# Patient Record
Sex: Female | Born: 1960 | Race: White | Hispanic: No | Smoking: Never smoker
Health system: Southern US, Community
[De-identification: ages and names within clinical notes are randomized; demographics above are authoritative.]

## PROBLEM LIST (undated history)

## (undated) DIAGNOSIS — Z803 Family history of malignant neoplasm of breast: Secondary | ICD-10-CM

## (undated) DIAGNOSIS — Z808 Family history of malignant neoplasm of other organs or systems: Secondary | ICD-10-CM

## (undated) DIAGNOSIS — Z8042 Family history of malignant neoplasm of prostate: Secondary | ICD-10-CM

## (undated) DIAGNOSIS — B009 Herpesviral infection, unspecified: Secondary | ICD-10-CM

## (undated) HISTORY — DX: Herpesviral infection, unspecified: B00.9

## (undated) HISTORY — DX: Family history of malignant neoplasm of prostate: Z80.42

## (undated) HISTORY — DX: Family history of malignant neoplasm of other organs or systems: Z80.8

## (undated) HISTORY — PX: KNEE SURGERY: SHX244

## (undated) HISTORY — DX: Family history of malignant neoplasm of breast: Z80.3

---

## 1987-09-18 HISTORY — PX: OTHER SURGICAL HISTORY: SHX169

## 1998-04-20 ENCOUNTER — Ambulatory Visit (HOSPITAL_COMMUNITY): Admission: RE | Admit: 1998-04-20 | Discharge: 1998-04-20 | Payer: Self-pay | Admitting: *Deleted

## 1999-05-11 ENCOUNTER — Ambulatory Visit (HOSPITAL_COMMUNITY): Admission: RE | Admit: 1999-05-11 | Discharge: 1999-05-11 | Payer: Self-pay | Admitting: *Deleted

## 1999-05-11 ENCOUNTER — Encounter (INDEPENDENT_AMBULATORY_CARE_PROVIDER_SITE_OTHER): Payer: Self-pay | Admitting: Specialist

## 1999-05-11 HISTORY — PX: DILATION AND CURETTAGE OF UTERUS: SHX78

## 1999-09-18 HISTORY — PX: LIPOMA EXCISION: SHX5283

## 2000-01-11 ENCOUNTER — Other Ambulatory Visit: Admission: RE | Admit: 2000-01-11 | Discharge: 2000-01-11 | Payer: Self-pay | Admitting: Plastic Surgery

## 2000-07-02 ENCOUNTER — Other Ambulatory Visit: Admission: RE | Admit: 2000-07-02 | Discharge: 2000-07-02 | Payer: Self-pay | Admitting: *Deleted

## 2001-05-05 ENCOUNTER — Other Ambulatory Visit: Admission: RE | Admit: 2001-05-05 | Discharge: 2001-05-05 | Payer: Self-pay | Admitting: *Deleted

## 2002-05-29 ENCOUNTER — Other Ambulatory Visit: Admission: RE | Admit: 2002-05-29 | Discharge: 2002-05-29 | Payer: Self-pay | Admitting: Obstetrics & Gynecology

## 2003-06-02 ENCOUNTER — Other Ambulatory Visit: Admission: RE | Admit: 2003-06-02 | Discharge: 2003-06-02 | Payer: Self-pay | Admitting: Obstetrics & Gynecology

## 2005-01-17 ENCOUNTER — Ambulatory Visit (HOSPITAL_COMMUNITY): Admission: RE | Admit: 2005-01-17 | Discharge: 2005-01-17 | Payer: Self-pay | Admitting: Obstetrics & Gynecology

## 2006-10-29 ENCOUNTER — Ambulatory Visit (HOSPITAL_COMMUNITY): Admission: RE | Admit: 2006-10-29 | Discharge: 2006-10-29 | Payer: Self-pay | Admitting: Obstetrics & Gynecology

## 2008-07-08 ENCOUNTER — Ambulatory Visit (HOSPITAL_COMMUNITY): Admission: RE | Admit: 2008-07-08 | Discharge: 2008-07-08 | Payer: Self-pay | Admitting: Obstetrics & Gynecology

## 2010-06-20 ENCOUNTER — Ambulatory Visit (HOSPITAL_COMMUNITY): Admission: RE | Admit: 2010-06-20 | Discharge: 2010-06-20 | Payer: Self-pay | Admitting: Obstetrics & Gynecology

## 2010-10-08 ENCOUNTER — Encounter: Payer: Self-pay | Admitting: Obstetrics & Gynecology

## 2012-05-14 ENCOUNTER — Other Ambulatory Visit (HOSPITAL_COMMUNITY): Payer: Self-pay | Admitting: Obstetrics & Gynecology

## 2012-05-14 DIAGNOSIS — Z1231 Encounter for screening mammogram for malignant neoplasm of breast: Secondary | ICD-10-CM

## 2012-05-30 ENCOUNTER — Ambulatory Visit (HOSPITAL_COMMUNITY)
Admission: RE | Admit: 2012-05-30 | Discharge: 2012-05-30 | Disposition: A | Discharge status: Home / Self Care | Payer: 59 | Source: Ambulatory Visit | Attending: Obstetrics & Gynecology | Admitting: Obstetrics & Gynecology

## 2012-05-30 DIAGNOSIS — Z1231 Encounter for screening mammogram for malignant neoplasm of breast: Secondary | ICD-10-CM | POA: Insufficient documentation

## 2014-02-19 ENCOUNTER — Other Ambulatory Visit (HOSPITAL_COMMUNITY): Payer: Self-pay | Admitting: Obstetrics & Gynecology

## 2014-02-19 DIAGNOSIS — Z1231 Encounter for screening mammogram for malignant neoplasm of breast: Secondary | ICD-10-CM

## 2014-03-15 ENCOUNTER — Ambulatory Visit (HOSPITAL_COMMUNITY)
Admission: RE | Admit: 2014-03-15 | Discharge: 2014-03-15 | Disposition: A | Discharge status: Home / Self Care | Payer: Private Health Insurance - Indemnity | Source: Ambulatory Visit | Attending: Obstetrics & Gynecology | Admitting: Obstetrics & Gynecology

## 2014-03-15 DIAGNOSIS — Z1231 Encounter for screening mammogram for malignant neoplasm of breast: Secondary | ICD-10-CM | POA: Insufficient documentation

## 2014-05-28 ENCOUNTER — Encounter: Payer: Self-pay | Admitting: Gastroenterology

## 2014-07-26 ENCOUNTER — Ambulatory Visit (AMBULATORY_SURGERY_CENTER): Payer: Self-pay | Admitting: *Deleted

## 2014-07-26 VITALS — Ht 64.0 in | Wt 156.0 lb

## 2014-07-26 DIAGNOSIS — Z1211 Encounter for screening for malignant neoplasm of colon: Secondary | ICD-10-CM

## 2014-07-26 MED ORDER — MOVIPREP 100 G PO SOLR: ORAL | Status: DC

## 2014-07-26 NOTE — Progress Notes (Signed)
Patient denies any allergies to eggs or soy. No past surgeries per pt. Patient denies any oxygen use at home and does not take any diet/weight loss medications. EMMI education assisgned to patient on colonoscopy, this was explained and instructions given to patient. 

## 2014-08-06 ENCOUNTER — Encounter: Payer: Self-pay | Admitting: Gastroenterology

## 2014-08-06 ENCOUNTER — Ambulatory Visit (AMBULATORY_SURGERY_CENTER): Payer: Managed Care, Other (non HMO) | Admitting: Gastroenterology

## 2014-08-06 VITALS — BP 110/61 | HR 64 | Temp 98.6°F | Resp 26 | Ht 64.0 in | Wt 156.0 lb

## 2014-08-06 DIAGNOSIS — Z1211 Encounter for screening for malignant neoplasm of colon: Secondary | ICD-10-CM

## 2014-08-06 DIAGNOSIS — D123 Benign neoplasm of transverse colon: Secondary | ICD-10-CM

## 2014-08-06 MED ORDER — SODIUM CHLORIDE 0.9 % IV SOLN: 500.0000 mL | INTRAVENOUS | Status: DC

## 2014-08-06 NOTE — Progress Notes (Signed)
Report to PACU, RN, vss, BBS= Clear.  

## 2014-08-06 NOTE — Patient Instructions (Signed)
YOU HAD AN ENDOSCOPIC PROCEDURE TODAY AT THE [facility]: Refer to the procedure report that was given to you for any specific questions about what was found during the examination.  If the procedure report does not answer your questions, please call your gastroenterologist to clarify.  If you requested that your care partner not be given the details of your procedure findings, then the procedure report has been included in a sealed envelope for you to review at your convenience later.  YOU SHOULD EXPECT: Some feelings of bloating in the abdomen. Passage of more gas than usual.  Walking can help get rid of the air that was put into your GI tract during the procedure and reduce the bloating. If you had a lower endoscopy (such as a colonoscopy or flexible sigmoidoscopy) you may notice spotting of blood in your stool or on the toilet paper. If you underwent a bowel prep for your procedure, then you may not have a normal bowel movement for a few days.  DIET: Your first meal following the procedure should be a light meal and then it is ok to progress to your normal diet.  A half-sandwich or bowl of soup is an example of a good first meal.  Heavy or fried foods are harder to digest and may make you feel nauseous or bloated.  Likewise meals heavy in dairy and vegetables can cause extra gas to form and this can also increase the bloating.  Drink plenty of fluids but you should avoid alcoholic beverages for 24 hours.  ACTIVITY: Your care partner should take you home directly after the procedure.  You should plan to take it easy, moving slowly for the rest of the day.  You can resume normal activity the day after the procedure however you should NOT DRIVE or use heavy machinery for 24 hours (because of the sedation medicines used during the test).    SYMPTOMS TO REPORT IMMEDIATELY: A gastroenterologist can be reached at any hour.  During normal business hours, 8:30 AM to 5:00 PM Monday through Friday,  call [phone].  After hours and on weekends, please call the GI answering service at [phone] who will take a message and have the physician on call contact you.   Following lower endoscopy (colonoscopy or flexible sigmoidoscopy):  Excessive amounts of blood in the stool  Significant tenderness or worsening of abdominal pains  Swelling of the abdomen that is new, acute  Fever of 100F or higher  FOLLOW UP: If any biopsies were taken you will be contacted by phone or by letter within the next 1-3 weeks.  Call your gastroenterologist if you have not heard about the biopsies in 3 weeks.  Our staff will call the home number listed on your records the next business day following your procedure to check on you and address any questions or concerns that you may have at that time regarding the information given to you following your procedure. This is a courtesy call and so if there is no answer at the home number and we have not heard from you through the emergency physician on call, we will assume that you have returned to your regular daily activities without incident.  SIGNATURES/CONFIDENTIALITY: You and/or your care partner have signed paperwork which will be entered into your electronic medical record.  These signatures attest to the fact that that the information above on your After Visit Summary has been reviewed and is understood.  Full responsibility of the confidentiality of this   discharge information lies with you and/or your care-partner.  Please read the handouts given to you by your recovery room nurse.

## 2014-08-06 NOTE — Progress Notes (Signed)
Called to room to assist during endoscopic procedure.  Patient ID and intended procedure confirmed with present staff. Received instructions for my participation in the procedure from the performing physician.  

## 2014-08-06 NOTE — Op Note (Signed)
[facility]  [address] [place], [address]   COLONOSCOPY PROCEDURE REPORT  PATIENT: Laura Pearson, Laura Pearson  MR#: [id number] BIRTHDATE: 05/07/1961 , 53  yrs. old GENDER: female ENDOSCOPIST: Milus Banister, MD PROCEDURE DATE:  08/06/2014 PROCEDURE:   Colonoscopy with snare polypectomy First Screening Colonoscopy - Avg.  risk and is 50 yrs.  old or older Yes.  Prior Negative Screening - Now for repeat screening. N/A  History of Adenoma - Now for follow-up colonoscopy & has been > or = to 3 yrs.  N/A  Polyps Removed Today? Yes. ASA CLASS:   Class II INDICATIONS:average risk for colon cancer. MEDICATIONS: Monitored anesthesia care and Propofol 240 mg IV  DESCRIPTION OF PROCEDURE:   After the risks benefits and alternatives of the procedure were thoroughly explained, informed consent was obtained.  The digital rectal exam revealed no abnormalities of the rectum.   The LB [id number]  endoscope was introduced through the anus and advanced to the cecum, which was identified by both the appendix and ileocecal valve. No adverse events experienced.   The quality of the prep was excellent.  The instrument was then slowly withdrawn as the colon was fully examined.  COLON FINDINGS: A sessile polyp measuring 4 mm in size was found in the transverse colon.  A polypectomy was performed with a cold snare.  The resection was complete, the polyp tissue was completely retrieved and sent to histology.  Retroflexed views revealed no abnormalities. The time to cecum=3 minutes 19 seconds.  Withdrawal time=11 minutes 08 seconds.  The scope was withdrawn and the procedure completed. COMPLICATIONS: There were no immediate complications.  ENDOSCOPIC IMPRESSION: Sessile polyp was found in the transverse colon; polypectomy was performed with a cold snare  RECOMMENDATIONS: If the polyp(s) removed today are proven to be adenomatous (pre-cancerous) polyps, you will need a repeat  colonoscopy in 5 years.  Otherwise you should continue to follow colorectal cancer screening guidelines for "routine risk" patients with colonoscopy in 10 years.  You will receive a letter within 1-2 weeks with the results of your biopsy as well as final recommendations.  Please call my office if you have not received a letter after 3 weeks.  eSigned:  Milus Banister, MD 08/06/2014 11:36 AM

## 2014-08-09 ENCOUNTER — Telehealth: Payer: Self-pay | Admitting: *Deleted

## 2014-08-09 NOTE — Telephone Encounter (Signed)
  Follow up Call-  Call back number 08/06/2014  Post procedure Call Back phone  # [phone]  Permission to leave phone message Yes    [facility]

## 2014-08-17 ENCOUNTER — Encounter: Payer: Self-pay | Admitting: Gastroenterology

## 2016-08-03 ENCOUNTER — Encounter: Payer: Self-pay | Admitting: Obstetrics & Gynecology

## 2017-03-14 ENCOUNTER — Encounter: Payer: Self-pay | Admitting: Obstetrics & Gynecology

## 2017-03-14 ENCOUNTER — Telehealth: Payer: Self-pay | Admitting: *Deleted

## 2017-03-14 ENCOUNTER — Ambulatory Visit (INDEPENDENT_AMBULATORY_CARE_PROVIDER_SITE_OTHER): Payer: 59 | Admitting: Obstetrics & Gynecology

## 2017-03-14 VITALS — BP 130/72 | Ht 64.0 in | Wt 150.0 lb

## 2017-03-14 DIAGNOSIS — Z01411 Encounter for gynecological examination (general) (routine) with abnormal findings: Secondary | ICD-10-CM

## 2017-03-14 DIAGNOSIS — Z780 Asymptomatic menopausal state: Secondary | ICD-10-CM

## 2017-03-14 DIAGNOSIS — F5102 Adjustment insomnia: Secondary | ICD-10-CM | POA: Diagnosis not present

## 2017-03-14 DIAGNOSIS — F419 Anxiety disorder, unspecified: Secondary | ICD-10-CM | POA: Diagnosis not present

## 2017-03-14 MED ORDER — ESTRADIOL 0.05 MG/24HR TD PTWK: 0.0500 mg | MEDICATED_PATCH | TRANSDERMAL | 4 refills | Status: DC

## 2017-03-14 MED ORDER — ESZOPICLONE 2 MG PO TABS: 2.0000 mg | ORAL_TABLET | Freq: Every evening | ORAL | 4 refills | Status: DC | PRN

## 2017-03-14 MED ORDER — PROGESTERONE MICRONIZED 100 MG PO CAPS: 100.0000 mg | ORAL_CAPSULE | Freq: Every day | ORAL | 4 refills | Status: DC

## 2017-03-14 MED ORDER — ALPRAZOLAM 0.5 MG PO TABS: 0.5000 mg | ORAL_TABLET | Freq: Two times a day (BID) | ORAL | 0 refills | Status: DC | PRN

## 2017-03-14 NOTE — Telephone Encounter (Signed)
Pt was seen today and 2 Rx printed for Lunesta 2mg  and Xanax 0.5 mg, I called both Rx into pharmacy listed [facility].

## 2017-03-14 NOTE — Progress Notes (Signed)
Laura Pearson 05-28-1961 [id number]   History:    56 y.o.  [id number] Married.  Husband on disability.  Patient is a Management consultant.  Son done with [facility], working.  Daughter coming back from [employer] for a course in Dental care.  RP:  Established patient presenting for annual gyn exam   HPI:  Menopause.  Well on Estradiol 0.05 patch/Prometrium 100 HS.  No PMB.  No pelvic pain.  Uses a vulvar moisturizer.  Still under stress, but managing better, organizing and making plans.  Uses Lunesta PRN for insomnia.  Planning a trip to [place] with her dad, needs Xanax for flight anxiety.  Breasts wnl.  Mictions/BMs wnl.  Past medical history,surgical history, family history and social history were all reviewed and documented in the EPIC chart.  Gynecologic History No LMP recorded. Patient is postmenopausal. Contraception: post menopausal status Last Pap: 05/2016. Results were: Neg/HPV HR neg Last mammogram: 07/2016. Results were: Negative [facility] 2015  Obstetric History OB History  Gravida Para Term Preterm AB Living  4 2     1 2   SAB TAB Ectopic Multiple Live Births  1            # Outcome Date GA Lbr Len/2nd Weight Sex Delivery Anes PTL Lv  4 Gravida           3 SAB           2 Para           1 Para                ROS: A ROS was performed and pertinent positives and negatives are included in the history.  GENERAL: No fevers or chills. HEENT: No change in vision, no earache, sore throat or sinus congestion. NECK: No pain or stiffness. CARDIOVASCULAR: No chest pain or pressure. No palpitations. PULMONARY: No shortness of breath, cough or wheeze. GASTROINTESTINAL: No abdominal pain, nausea, vomiting or diarrhea, melena or bright red blood per rectum. GENITOURINARY: No urinary frequency, urgency, hesitancy or dysuria. MUSCULOSKELETAL: No joint or muscle pain, no back pain, no recent trauma. DERMATOLOGIC: No rash, no itching, no lesions. ENDOCRINE: No polyuria, polydipsia, no heat or cold  intolerance. No recent change in weight. HEMATOLOGICAL: No anemia or easy bruising or bleeding. NEUROLOGIC: No headache, seizures, numbness, tingling or weakness. PSYCHIATRIC: No depression, no loss of interest in normal activity or change in sleep pattern.     Exam:   BP 130/72   Ht 5\' 4"  (1.626 m)   Wt 150 lb (68 kg)   BMI 25.75 kg/m   Body mass index is 25.75 kg/m.  General appearance : Well developed well nourished female. No acute distress HEENT: Eyes: no retinal hemorrhage or exudates,  Neck supple, trachea midline, no carotid bruits, no thyroidmegaly Lungs: Clear to auscultation, no rhonchi or wheezes, or rib retractions  Heart: Regular rate and rhythm, no murmurs or gallops Breast:Examined in sitting and supine position were symmetrical in appearance, no palpable masses or tenderness,  no skin retraction, no nipple inversion, no nipple discharge, no skin discoloration, no axillary or supraclavicular lymphadenopathy Abdomen: no palpable masses or tenderness, no rebound or guarding Extremities: no edema or skin discoloration or tenderness  Pelvic:  Bartholin, Urethra, Skene Glands: Within normal limits             Vagina: No gross lesions or discharge  Cervix: No gross lesions or discharge  Uterus  AV, normal size, shape and consistency, non-tender and mobile  Adnexa  Without masses or tenderness  Anus and perineum  normal    Assessment/Plan:  56 y.o. female for annual exam   1. Encounter for gynecological examination with abnormal finding Normal gyn exam except mild Atrophic vaginitis.  Well with vulvar moisturizer, will try Replens.  Pap neg/HPV HR neg 05/2016, will repeat next year.  Breasts wnl.  Next screening Mammo 07/2017.  2. Menopause present Symptoms well controled on Estradiol patch 0.05 and Prometrium 100 mg HS, represcribed.  Will call back if needs Estradiol cream vaginally and on vulva.  3. Adjustment insomnia Lunesta represcribed.  Recommend not taking  every night.  Encouraged to exercise regularly.  4. Anxiety Flight anxiety.  Xanax 0.5 mg #4, no refill.  Prescribed for upcoming trip.  Counseling on above issues >50% x 10 minutes.  Princess Bruins MD, 8:31 AM 03/14/2017

## 2017-03-14 NOTE — Patient Instructions (Signed)
1. Encounter for gynecological examination with abnormal finding Normal gyn exam except mild Atrophic vaginitis.  Well with vulvar moisturizer, will try Replens.  Pap neg/HPV HR neg 05/2016, will repeat next year.  Breasts wnl.  Next screening Mammo 07/2017.  2. Menopause present Symptoms well controled on Estradiol patch 0.05 and Prometrium 100 mg HS, represcribed.  Will call back if needs Estradiol cream vaginally and on vulva.  3. Adjustment insomnia Lunesta represcribed.  Recommend not taking every night.  Encouraged to exercise regularly.  4. Anxiety Flight anxiety.  Xanax 0.5 mg #4, no refill.  Prescribed for upcoming trip.  Glorine, it was a pleasure to see you today!    Health Maintenance, Female Adopting a healthy lifestyle and getting preventive care can go a long way to promote health and wellness. Talk with your health care provider about what schedule of regular examinations is right for you. This is a good chance for you to check in with your provider about disease prevention and staying healthy. In between checkups, there are plenty of things you can do on your own. Experts have done a lot of research about which lifestyle changes and preventive measures are most likely to keep you healthy. Ask your health care provider for more information. Weight and diet Eat a healthy diet  Be sure to include plenty of vegetables, fruits, low-fat dairy products, and lean protein.  Do not eat a lot of foods high in solid fats, added sugars, or salt.  Get regular exercise. This is one of the most important things you can do for your health. ? Most adults should exercise for at least 150 minutes each week. The exercise should increase your heart rate and make you sweat (moderate-intensity exercise). ? Most adults should also do strengthening exercises at least twice a week. This is in addition to the moderate-intensity exercise.  Maintain a healthy weight  Body mass index (BMI) is a  measurement that can be used to identify possible weight problems. It estimates body fat based on height and weight. Your health care provider can help determine your BMI and help you achieve or maintain a healthy weight.  For females 32 years of age and older: ? A BMI below 18.5 is considered underweight. ? A BMI of 18.5 to 24.9 is normal. ? A BMI of 25 to 29.9 is considered overweight. ? A BMI of 30 and above is considered obese.  Watch levels of cholesterol and blood lipids  You should start having your blood tested for lipids and cholesterol at 55 years of age, then have this test every 5 years.  You may need to have your cholesterol levels checked more often if: ? Your lipid or cholesterol levels are high. ? You are older than 55 years of age. ? You are at high risk for heart disease.  Cancer screening Lung Cancer  Lung cancer screening is recommended for adults 20-50 years old who are at high risk for lung cancer because of a history of smoking.  A yearly low-dose CT scan of the lungs is recommended for people who: ? Currently smoke. ? Have quit within the past 15 years. ? Have at least a 30-pack-year history of smoking. A pack year is smoking an average of one pack of cigarettes a day for 1 year.  Yearly screening should continue until it has been 15 years since you quit.  Yearly screening should stop if you develop a health problem that would prevent you from having lung cancer treatment.  Breast Cancer  Practice breast self-awareness. This means understanding how your breasts normally appear and feel.  It also means doing regular breast self-exams. Let your health care provider know about any changes, no matter how small.  If you are in your 20s or 30s, you should have a clinical breast exam (CBE) by a health care provider every 1-3 years as part of a regular health exam.  If you are 84 or older, have a CBE every year. Also consider having a breast X-ray (mammogram)  every year.  If you have a family history of breast cancer, talk to your health care provider about genetic screening.  If you are at high risk for breast cancer, talk to your health care provider about having an MRI and a mammogram every year.  Breast cancer gene (BRCA) assessment is recommended for women who have family members with BRCA-related cancers. BRCA-related cancers include: ? Breast. ? Ovarian. ? Tubal. ? Peritoneal cancers.  Results of the assessment will determine the need for genetic counseling and BRCA1 and BRCA2 testing.  Cervical Cancer Your health care provider may recommend that you be screened regularly for cancer of the pelvic organs (ovaries, uterus, and vagina). This screening involves a pelvic examination, including checking for microscopic changes to the surface of your cervix (Pap test). You may be encouraged to have this screening done every 3 years, beginning at age 11.  For women ages 34-65, health care providers may recommend pelvic exams and Pap testing every 3 years, or they may recommend the Pap and pelvic exam, combined with testing for human papilloma virus (HPV), every 5 years. Some types of HPV increase your risk of cervical cancer. Testing for HPV may also be done on women of any age with unclear Pap test results.  Other health care providers may not recommend any screening for nonpregnant women who are considered low risk for pelvic cancer and who do not have symptoms. Ask your health care provider if a screening pelvic exam is right for you.  If you have had past treatment for cervical cancer or a condition that could lead to cancer, you need Pap tests and screening for cancer for at least 20 years after your treatment. If Pap tests have been discontinued, your risk factors (such as having a new sexual partner) need to be reassessed to determine if screening should resume. Some women have medical problems that increase the chance of getting cervical  cancer. In these cases, your health care provider may recommend more frequent screening and Pap tests.  Colorectal Cancer  This type of cancer can be detected and often prevented.  Routine colorectal cancer screening usually begins at 55 years of age and continues through 55 years of age.  Your health care provider may recommend screening at an earlier age if you have risk factors for colon cancer.  Your health care provider may also recommend using home test kits to check for hidden blood in the stool.  A small camera at the end of a tube can be used to examine your colon directly (sigmoidoscopy or colonoscopy). This is done to check for the earliest forms of colorectal cancer.  Routine screening usually begins at age 34.  Direct examination of the colon should be repeated every 5-10 years through 55 years of age. However, you may need to be screened more often if early forms of precancerous polyps or small growths are found.  Skin Cancer  Check your skin from head to toe regularly.  Tell  your health care provider about any new moles or changes in moles, especially if there is a change in a mole's shape or color.  Also tell your health care provider if you have a mole that is larger than the size of a pencil eraser.  Always use sunscreen. Apply sunscreen liberally and repeatedly throughout the day.  Protect yourself by wearing long sleeves, pants, a wide-brimmed hat, and sunglasses whenever you are outside.  Heart disease, diabetes, and high blood pressure  High blood pressure causes heart disease and increases the risk of stroke. High blood pressure is more likely to develop in: ? People who have blood pressure in the high end of the normal range (130-139/85-89 mm Hg). ? People who are overweight or obese. ? People who are African American.  If you are 36-65 years of age, have your blood pressure checked every 3-5 years. If you are 94 years of age or older, have your blood  pressure checked every year. You should have your blood pressure measured twice-once when you are at a hospital or clinic, and once when you are not at a hospital or clinic. Record the average of the two measurements. To check your blood pressure when you are not at a hospital or clinic, you can use: ? An automated blood pressure machine at a pharmacy. ? A home blood pressure monitor.  If you are between 19 years and 46 years old, ask your health care provider if you should take aspirin to prevent strokes.  Have regular diabetes screenings. This involves taking a blood sample to check your fasting blood sugar level. ? If you are at a normal weight and have a low risk for diabetes, have this test once every three years after 55 years of age. ? If you are overweight and have a high risk for diabetes, consider being tested at a younger age or more often. Preventing infection Hepatitis B  If you have a higher risk for hepatitis B, you should be screened for this virus. You are considered at high risk for hepatitis B if: ? You were born in a country where hepatitis B is common. Ask your health care provider which countries are considered high risk. ? Your parents were born in a high-risk country, and you have not been immunized against hepatitis B (hepatitis B vaccine). ? You have HIV or AIDS. ? You use needles to inject street drugs. ? You live with someone who has hepatitis B. ? You have had sex with someone who has hepatitis B. ? You get hemodialysis treatment. ? You take certain medicines for conditions, including cancer, organ transplantation, and autoimmune conditions.  Hepatitis C  Blood testing is recommended for: ? Everyone born from [birth year] through 1964. ? Anyone with known risk factors for hepatitis C.  Sexually transmitted infections (STIs)  You should be screened for sexually transmitted infections (STIs) including gonorrhea and chlamydia if: ? You are sexually active and are  younger than 55 years of age. ? You are older than 55 years of age and your health care provider tells you that you are at risk for this type of infection. ? Your sexual activity has changed since you were last screened and you are at an increased risk for chlamydia or gonorrhea. Ask your health care provider if you are at risk.  If you do not have HIV, but are at risk, it may be recommended that you take a prescription medicine daily to prevent HIV infection. This is called pre-exposure  prophylaxis (PrEP). You are considered at risk if: ? You are sexually active and do not regularly use condoms or know the HIV status of your partner(s). ? You take drugs by injection. ? You are sexually active with a partner who has HIV.  Talk with your health care provider about whether you are at high risk of being infected with HIV. If you choose to begin PrEP, you should first be tested for HIV. You should then be tested every 3 months for as long as you are taking PrEP. Pregnancy  If you are premenopausal and you may become pregnant, ask your health care provider about preconception counseling.  If you may become pregnant, take 400 to 800 micrograms (mcg) of folic acid every day.  If you want to prevent pregnancy, talk to your health care provider about birth control (contraception). Osteoporosis and menopause  Osteoporosis is a disease in which the bones lose minerals and strength with aging. This can result in serious bone fractures. Your risk for osteoporosis can be identified using a bone density scan.  If you are 75 years of age or older, or if you are at risk for osteoporosis and fractures, ask your health care provider if you should be screened.  Ask your health care provider whether you should take a calcium or vitamin D supplement to lower your risk for osteoporosis.  Menopause may have certain physical symptoms and risks.  Hormone replacement therapy may reduce some of these symptoms and  risks. Talk to your health care provider about whether hormone replacement therapy is right for you. Follow these instructions at home:  Schedule regular health, dental, and eye exams.  Stay current with your immunizations.  Do not use any tobacco products including cigarettes, chewing tobacco, or electronic cigarettes.  If you are pregnant, do not drink alcohol.  If you are breastfeeding, limit how much and how often you drink alcohol.  Limit alcohol intake to no more than 1 drink per day for nonpregnant women. One drink equals 12 ounces of beer, 5 ounces of wine, or 1 ounces of hard liquor.  Do not use street drugs.  Do not share needles.  Ask your health care provider for help if you need support or information about quitting drugs.  Tell your health care provider if you often feel depressed.  Tell your health care provider if you have ever been abused or do not feel safe at home. This information is not intended to replace advice given to you by your health care provider. Make sure you discuss any questions you have with your health care provider. Document Released: 03/19/2011 Document Revised: 02/09/2016 Document Reviewed: 06/07/2015 Elsevier Interactive Patient Education  [employer].

## 2017-09-25 ENCOUNTER — Other Ambulatory Visit: Payer: Self-pay

## 2017-09-25 MED ORDER — ESZOPICLONE 2 MG PO TABS: 2.0000 mg | ORAL_TABLET | Freq: Every evening | ORAL | 4 refills | Status: DC | PRN

## 2017-09-26 NOTE — Telephone Encounter (Signed)
Called into pharmacy

## 2017-12-11 ENCOUNTER — Telehealth: Payer: Self-pay | Admitting: *Deleted

## 2017-12-11 NOTE — Telephone Encounter (Signed)
Pt called and left message stating she has questions about her "medication" I called back and received pt voicemail, I asked her to call me with questions.

## 2017-12-12 MED ORDER — PROGESTERONE MICRONIZED 200 MG PO CAPS: 200.0000 mg | ORAL_CAPSULE | Freq: Every day | ORAL | 2 refills | Status: DC

## 2017-12-12 NOTE — Telephone Encounter (Signed)
Agree to try Prometrium 200 mg HS and increase her Lunesta to 3 mg HS as needed.  Please send prescriptions to last until next Annual/Gyn exam at the end of 02/2018.

## 2017-12-12 NOTE — Telephone Encounter (Signed)
I left a detailed message on pt cell that progesterone Rx has been sent in. And to call if she would also like lunesta Rx increased.

## 2017-12-12 NOTE — Telephone Encounter (Signed)
Pt called and left a detailed message regarding issues with  sleep disturbance. 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   1. Patient asked if maybe increased progesterone dose to 200 mg would maybe help with sleep as well? If so she would like to increase to try.  2. She takes Lunesta 2 mg dose asked if maybe this should be increased as well. Pt said she doesn't sleep a full 7 hours, her 22 year daughter will be having a baby in May.  Please advise

## 2018-03-14 ENCOUNTER — Other Ambulatory Visit: Payer: Self-pay | Admitting: Obstetrics & Gynecology

## 2018-04-01 ENCOUNTER — Encounter: Payer: 59 | Admitting: Obstetrics & Gynecology

## 2018-04-09 ENCOUNTER — Ambulatory Visit (INDEPENDENT_AMBULATORY_CARE_PROVIDER_SITE_OTHER): Payer: 59 | Admitting: Obstetrics & Gynecology

## 2018-04-09 ENCOUNTER — Encounter: Payer: Self-pay | Admitting: Obstetrics & Gynecology

## 2018-04-09 VITALS — BP 112/70 | Ht 63.5 in | Wt 156.0 lb

## 2018-04-09 DIAGNOSIS — G4700 Insomnia, unspecified: Secondary | ICD-10-CM | POA: Diagnosis not present

## 2018-04-09 DIAGNOSIS — Z79890 Hormone replacement therapy (postmenopausal): Secondary | ICD-10-CM | POA: Diagnosis not present

## 2018-04-09 DIAGNOSIS — Z01419 Encounter for gynecological examination (general) (routine) without abnormal findings: Secondary | ICD-10-CM

## 2018-04-09 DIAGNOSIS — F419 Anxiety disorder, unspecified: Secondary | ICD-10-CM

## 2018-04-09 MED ORDER — ALPRAZOLAM 0.5 MG PO TABS: 0.5000 mg | ORAL_TABLET | Freq: Two times a day (BID) | ORAL | 0 refills | Status: DC | PRN

## 2018-04-09 MED ORDER — ESTRADIOL 0.05 MG/24HR TD PTWK: 0.0500 mg | MEDICATED_PATCH | TRANSDERMAL | 4 refills | Status: DC

## 2018-04-09 MED ORDER — PROGESTERONE MICRONIZED 200 MG PO CAPS: 200.0000 mg | ORAL_CAPSULE | Freq: Every day | ORAL | 4 refills | Status: DC

## 2018-04-09 MED ORDER — ESZOPICLONE 2 MG PO TABS: 2.0000 mg | ORAL_TABLET | Freq: Every evening | ORAL | 4 refills | Status: DC | PRN

## 2018-04-09 NOTE — Progress Notes (Signed)
Laura Pearson [birth year] [id number]   History:    57 y.o. G4P2A2L2 Married.  Patient is a Management consultant.  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  Daughter just had a son.  RP:  Established patient presenting for annual gyn exam   HPI: Menopause, well on HRT.  No PMB.  No pelvic pain.  Urine and bowel movements normal.  Breasts normal.  Body mass index 27.20.  Well on Lunesta.  Anxiety when traveling, Xanax helps.  Will follow up here for fasting health labs.  Past medical history,surgical history, family history and social history were all reviewed and documented in the EPIC chart.  Gynecologic History No LMP recorded. Patient is postmenopausal. Contraception: abstinence and post menopausal status Last Pap: 2017. Results were: Negative/HPV HR neg Last mammogram: 2017. Results were: Negative.  Will schedule at [place]: Never Colonoscopy: 2015 normal  Obstetric History OB History  Gravida Para Term Preterm AB Living  '4 2     1 2  '$ SAB TAB Ectopic Multiple Live Births  1            # Outcome Date GA Lbr Len/2nd Weight Sex Delivery Anes PTL Lv  4 Gravida           3 SAB           2 Para           1 Para              ROS: A ROS was performed and pertinent positives and negatives are included in the history.  GENERAL: No fevers or chills. HEENT: No change in vision, no earache, sore throat or sinus congestion. NECK: No pain or stiffness. CARDIOVASCULAR: No chest pain or pressure. No palpitations. PULMONARY: No shortness of breath, cough or wheeze. GASTROINTESTINAL: No abdominal pain, nausea, vomiting or diarrhea, melena or bright red blood per rectum. GENITOURINARY: No urinary frequency, urgency, hesitancy or dysuria. MUSCULOSKELETAL: No joint or muscle pain, no back pain, no recent trauma. DERMATOLOGIC: No rash, no itching, no lesions. ENDOCRINE: No polyuria, polydipsia, no heat or cold intolerance. No recent change in weight. HEMATOLOGICAL: No anemia or easy bruising or  bleeding. NEUROLOGIC: No headache, seizures, numbness, tingling or weakness. PSYCHIATRIC: No depression, no loss of interest in normal activity or change in sleep pattern.     Exam:   BP 112/70   Ht 5' 3.5" (1.613 m)   Wt 156 lb (70.8 kg)   BMI 27.20 kg/m   Body mass index is 27.2 kg/m.  General appearance : Well developed well nourished female. No acute distress HEENT: Eyes: no retinal hemorrhage or exudates,  Neck supple, trachea midline, no carotid bruits, no thyroidmegaly Lungs: Clear to auscultation, no rhonchi or wheezes, or rib retractions  Heart: Regular rate and rhythm, no murmurs or gallops Breast:Examined in sitting and supine position were symmetrical in appearance, no palpable masses or tenderness,  no skin retraction, no nipple inversion, no nipple discharge, no skin discoloration, no axillary or supraclavicular lymphadenopathy Abdomen: no palpable masses or tenderness, no rebound or guarding Extremities: no edema or skin discoloration or tenderness  Pelvic: Vulva: Normal             Vagina: No gross lesions or discharge  Cervix: No gross lesions or discharge.  Pap reflex done  Uterus  AV, normal size, shape and consistency, non-tender and mobile  Adnexa  Without masses or tenderness  Anus: Normal   Assessment/Plan:  57 y.o. female for annual exam  1. Encounter for routine gynecological examination with Papanicolaou smear of cervix Normal gynecologic exam.  Pap reflex done.  Breast exam normal.  Patient will schedule her screening mammogram at [place] now.  We will follow-up here for fasting health labs.  Colonoscopy in 2015. - Pap IG w/ reflex to HPV when ASC-U - CBC; Future - Comp Met (CMET); Future - TSH; Future - Lipid panel; Future - VITAMIN D 25 Hydroxy   2. Post-menopause on HRT (hormone replacement therapy) Well on estradiol patch 0.05 weekly with Prometrium 200 mg at bedtime.  Patient does better with 200 mg and 100 mg at bedtime.  No  contraindication to hormone replacement therapy.  Prescription sent to pharmacy.  Recommend vitamin D supplements, calcium rich nutrition and regular weightbearing physical activity.  3. Insomnia, unspecified type Doing well on Lunesta, same prescription sent to pharmacy.  4. Anxiety Xanax for travels.  4 tablets represcribed.  Other orders - progesterone (PROMETRIUM) 200 MG capsule; Take 1 capsule (200 mg total) by mouth at bedtime. - estradiol (CLIMARA - DOSED IN MG/24 HR) 0.05 mg/24hr patch; Place 1 patch (0.05 mg total) onto the skin once a week. - ALPRAZolam (XANAX) 0.5 MG tablet; Take 1 tablet (0.5 mg total) by mouth 2 (two) times daily as needed for anxiety. - eszopiclone (LUNESTA) 2 MG TABS tablet; Take 1 tablet (2 mg total) by mouth at bedtime as needed for sleep. Take immediately before bedtime  Princess Bruins MD, 11:51 AM 04/09/2018

## 2018-04-10 LAB — PAP IG W/ RFLX HPV ASCU

## 2018-04-13 ENCOUNTER — Encounter: Payer: Self-pay | Admitting: Obstetrics & Gynecology

## 2018-04-13 NOTE — Patient Instructions (Signed)
1. Encounter for routine gynecological examination with Papanicolaou smear of cervix Normal gynecologic exam.  Pap reflex done.  Breast exam normal.  Patient will schedule her screening mammogram at [place] now.  We will follow-up here for fasting health labs.  Colonoscopy in 2015. - Pap IG w/ reflex to HPV when ASC-U - CBC; Future - Comp Met (CMET); Future - TSH; Future - Lipid panel; Future - VITAMIN D 25 Hydroxy   2. Post-menopause on HRT (hormone replacement therapy) Well on estradiol patch 0.05 weekly with Prometrium 200 mg at bedtime.  Patient does better with 200 mg and 100 mg at bedtime.  No contraindication to hormone replacement therapy.  Prescription sent to pharmacy.  Recommend vitamin D supplements, calcium rich nutrition and regular weightbearing physical activity.  3. Insomnia, unspecified type Doing well on Lunesta, same prescription sent to pharmacy.  4. Anxiety Xanax for travels.  4 tablets represcribed.  Other orders - progesterone (PROMETRIUM) 200 MG capsule; Take 1 capsule (200 mg total) by mouth at bedtime. - estradiol (CLIMARA - DOSED IN MG/24 HR) 0.05 mg/24hr patch; Place 1 patch (0.05 mg total) onto the skin once a week. - ALPRAZolam (XANAX) 0.5 MG tablet; Take 1 tablet (0.5 mg total) by mouth 2 (two) times daily as needed for anxiety. - eszopiclone (LUNESTA) 2 MG TABS tablet; Take 1 tablet (2 mg total) by mouth at bedtime as needed for sleep. Take immediately before bedtime  Laura Pearson, it was a pleasure seeing you today!  I will inform you of your results as soon as they are available.

## 2018-04-18 ENCOUNTER — Other Ambulatory Visit: Payer: 59

## 2018-04-18 ENCOUNTER — Telehealth: Payer: Self-pay | Admitting: *Deleted

## 2018-04-18 DIAGNOSIS — Z01419 Encounter for gynecological examination (general) (routine) without abnormal findings: Secondary | ICD-10-CM

## 2018-04-18 MED ORDER — ALPRAZOLAM 0.5 MG PO TABS: 0.5000 mg | ORAL_TABLET | Freq: Two times a day (BID) | ORAL | 0 refills | Status: DC | PRN

## 2018-04-18 MED ORDER — ESZOPICLONE 2 MG PO TABS: 2.0000 mg | ORAL_TABLET | Freq: Every evening | ORAL | 4 refills | Status: DC | PRN

## 2018-04-18 NOTE — Telephone Encounter (Signed)
Patient was seen on 04/09/18 and [facility] never received Lunesta 2 mg and Xanax 0.5 mg tablet Rx's. Both were set on phone in. I will call in Xanax 0.5 mg and  Lunesta 2 mg. Left on pt voicemail Rx called in.

## 2018-04-19 LAB — CBC
HEMATOCRIT: 41.3 % (ref 35.0–45.0)
HEMOGLOBIN: 14 g/dL (ref 11.7–15.5)
MCH: 30.9 pg (ref 27.0–33.0)
MCHC: 33.9 g/dL (ref 32.0–36.0)
MCV: 91.2 fL (ref 80.0–100.0)
MPV: 9 fL (ref 7.5–12.5)
Platelets: 284 10*3/uL (ref 140–400)
RBC: 4.53 10*6/uL (ref 3.80–5.10)
RDW: 11.9 % (ref 11.0–15.0)
WBC: 6 10*3/uL (ref 3.8–10.8)

## 2018-04-19 LAB — COMPREHENSIVE METABOLIC PANEL
AG Ratio: 2 (calc) (ref 1.0–2.5)
ALBUMIN MSPROF: 4.5 g/dL (ref 3.6–5.1)
ALKALINE PHOSPHATASE (APISO): 88 U/L (ref 33–130)
ALT: 19 U/L (ref 6–29)
AST: 18 U/L (ref 10–35)
BUN: 20 mg/dL (ref 7–25)
CO2: 29 mmol/L (ref 20–32)
CREATININE: 0.9 mg/dL (ref 0.50–1.05)
Calcium: 9.3 mg/dL (ref 8.6–10.4)
Chloride: 105 mmol/L (ref 98–110)
GLOBULIN: 2.2 g/dL (ref 1.9–3.7)
GLUCOSE: 97 mg/dL (ref 65–99)
POTASSIUM: 4.6 mmol/L (ref 3.5–5.3)
Sodium: 140 mmol/L (ref 135–146)
Total Bilirubin: 0.3 mg/dL (ref 0.2–1.2)
Total Protein: 6.7 g/dL (ref 6.1–8.1)

## 2018-04-19 LAB — LIPID PANEL
Cholesterol: 202 mg/dL — ABNORMAL HIGH (ref <200)
HDL: 80 mg/dL (ref >50)
LDL Cholesterol (Calc): 108 mg/dL (calc) — ABNORMAL HIGH
Non-HDL Cholesterol (Calc): 122 mg/dL (calc) (ref <130)
Total CHOL/HDL Ratio: 2.5 (calc) (ref <5.0)
Triglycerides: 54 mg/dL (ref <150)

## 2018-04-19 LAB — VITAMIN D 25 HYDROXY (VIT D DEFICIENCY, FRACTURES): VIT D 25 HYDROXY: 29 ng/mL — AB (ref 30–100)

## 2018-04-19 LAB — TSH: TSH: 1.13 mIU/L

## 2018-06-14 ENCOUNTER — Encounter: Payer: Self-pay | Admitting: Obstetrics & Gynecology

## 2018-09-03 ENCOUNTER — Ambulatory Visit
Admission: RE | Admit: 2018-09-03 | Discharge: 2018-09-03 | Disposition: A | Discharge status: Home / Self Care | Payer: 59 | Source: Ambulatory Visit | Attending: Family Medicine | Admitting: Family Medicine

## 2018-09-03 ENCOUNTER — Other Ambulatory Visit: Payer: Self-pay | Admitting: Family Medicine

## 2018-09-03 DIAGNOSIS — M545 Low back pain, unspecified: Secondary | ICD-10-CM

## 2018-09-03 DIAGNOSIS — M25562 Pain in left knee: Secondary | ICD-10-CM

## 2018-10-10 ENCOUNTER — Other Ambulatory Visit: Payer: Self-pay | Admitting: Family Medicine

## 2018-10-10 DIAGNOSIS — E559 Vitamin D deficiency, unspecified: Secondary | ICD-10-CM

## 2018-12-12 ENCOUNTER — Other Ambulatory Visit: Payer: 59

## 2019-04-16 ENCOUNTER — Other Ambulatory Visit: Payer: Self-pay | Admitting: Family Medicine

## 2019-04-16 DIAGNOSIS — E2839 Other primary ovarian failure: Secondary | ICD-10-CM

## 2019-04-24 ENCOUNTER — Encounter: Payer: 59 | Admitting: Obstetrics & Gynecology

## 2019-04-27 ENCOUNTER — Telehealth: Payer: Self-pay | Admitting: *Deleted

## 2019-04-27 MED ORDER — ESTRADIOL 0.05 MG/24HR TD PTWK: 0.0500 mg | MEDICATED_PATCH | TRANSDERMAL | 0 refills | Status: DC

## 2019-04-27 NOTE — Telephone Encounter (Signed)
Patient annual was rescheduled by provider, now scheduled on 05/15/19, needs refill on climara patch. Rx sent.

## 2019-05-15 ENCOUNTER — Other Ambulatory Visit: Payer: Self-pay

## 2019-05-15 ENCOUNTER — Other Ambulatory Visit: Payer: Self-pay | Admitting: Obstetrics & Gynecology

## 2019-05-15 ENCOUNTER — Telehealth: Payer: Self-pay | Admitting: *Deleted

## 2019-05-15 ENCOUNTER — Encounter: Payer: Self-pay | Admitting: Obstetrics & Gynecology

## 2019-05-15 ENCOUNTER — Ambulatory Visit (INDEPENDENT_AMBULATORY_CARE_PROVIDER_SITE_OTHER): Payer: 59 | Admitting: Obstetrics & Gynecology

## 2019-05-15 VITALS — BP 122/78 | Ht 63.75 in | Wt 144.0 lb

## 2019-05-15 DIAGNOSIS — Z79890 Hormone replacement therapy (postmenopausal): Secondary | ICD-10-CM

## 2019-05-15 DIAGNOSIS — Z01419 Encounter for gynecological examination (general) (routine) without abnormal findings: Secondary | ICD-10-CM | POA: Diagnosis not present

## 2019-05-15 DIAGNOSIS — Z803 Family history of malignant neoplasm of breast: Secondary | ICD-10-CM

## 2019-05-15 DIAGNOSIS — Z1231 Encounter for screening mammogram for malignant neoplasm of breast: Secondary | ICD-10-CM

## 2019-05-15 MED ORDER — PROGESTERONE MICRONIZED 200 MG PO CAPS: 200.0000 mg | ORAL_CAPSULE | Freq: Every day | ORAL | 4 refills | Status: DC

## 2019-05-15 MED ORDER — ESTRADIOL 0.0375 MG/24HR TD PTWK: 0.0375 mg | MEDICATED_PATCH | TRANSDERMAL | 4 refills | Status: DC

## 2019-05-15 NOTE — Progress Notes (Signed)
Laura Pearson January 09, 1961 [id number]   History:    58 y.o. [id number] Married. Patient is a Management consultant.  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  Daughter has a 85 yo son.  RP:  Established patient presenting for annual gyn exam   HPI: Menopause, well on HRT with Climara 0.05 and Prometrium 200 HS.  No PMB.  No pelvic pain.  Abstinent.  Urine and bowel movements normal.  Breasts normal.  Sister with Breast Ca at 84 yo, no genetic testing done.  Body mass index decreased to 24.91.  Father died of Ketoacidosis, so making efforts to reduce carbs.  Walking.  Just received a CS injection in the left knee for Arthritis.  Good HBA1C at 5.5 with Fam MD.  Well on Lunesta.  Anxiety when traveling, Xanax helps.  Fasting health labs with Dr Darron Doom.   Past medical history,surgical history, family history and social history were all reviewed and documented in the EPIC chart.  Gynecologic History No LMP recorded. Patient is postmenopausal. Contraception: abstinence and post menopausal status Last Pap: 03/2018. Results were: Negative Last mammogram: 2019. Results were: Normal per patient, will obtain results.  [facility]. Bone Density: [facility] the [facility] in 06/2019 Colonoscopy: 2015.  Planning Cologard.  Obstetric History OB History  Gravida Para Term Preterm AB Living  4 2     1 2   SAB TAB Ectopic Multiple Live Births  1            # Outcome Date GA Lbr Len/2nd Weight Sex Delivery Anes PTL Lv  4 Gravida           3 SAB           2 Para           1 Para              ROS: A ROS was performed and pertinent positives and negatives are included in the history.  GENERAL: No fevers or chills. HEENT: No change in vision, no earache, sore throat or sinus congestion. NECK: No pain or stiffness. CARDIOVASCULAR: No chest pain or pressure. No palpitations. PULMONARY: No shortness of breath, cough or wheeze. GASTROINTESTINAL: No abdominal pain, nausea, vomiting or diarrhea, melena or  bright red blood per rectum. GENITOURINARY: No urinary frequency, urgency, hesitancy or dysuria. MUSCULOSKELETAL: No joint or muscle pain, no back pain, no recent trauma. DERMATOLOGIC: No rash, no itching, no lesions. ENDOCRINE: No polyuria, polydipsia, no heat or cold intolerance. No recent change in weight. HEMATOLOGICAL: No anemia or easy bruising or bleeding. NEUROLOGIC: No headache, seizures, numbness, tingling or weakness. PSYCHIATRIC: No depression, no loss of interest in normal activity or change in sleep pattern.     Exam:   BP 122/78   Ht 5' 3.75" (1.619 m)   Wt 144 lb (65.3 kg)   BMI 24.91 kg/m   Body mass index is 24.91 kg/m.  General appearance : Well developed well nourished female. No acute distress HEENT: Eyes: no retinal hemorrhage or exudates,  Neck supple, trachea midline, no carotid bruits, no thyroidmegaly Lungs: Clear to auscultation, no rhonchi or wheezes, or rib retractions  Heart: Regular rate and rhythm, no murmurs or gallops Breast:Examined in sitting and supine position were symmetrical in appearance, no palpable masses or tenderness,  no skin retraction, no nipple inversion, no nipple discharge, no skin discoloration, no axillary or supraclavicular lymphadenopathy Abdomen: no palpable masses or tenderness, no rebound or guarding Extremities: no edema or skin discoloration or tenderness  Pelvic: Vulva: Normal             Vagina: No gross lesions or discharge  Cervix: No gross lesions or discharge  Uterus  AV, normal size, shape and consistency, non-tender and mobile  Adnexa  Without masses or tenderness  Anus: Normal   Assessment/Plan:  58 y.o. female for annual exam   1. Well female exam with routine gynecological exam Normal gynecologic exam.  Pap test July 2019 was negative, no indication to repeat this year.  Breast exam normal.  Screening mammogram 2019 was normal per patient will obtain report and she will switch to the breast center for her  screening mammograms.  Health labs with family physician.  Good body mass index at 24.91.  Continue with fitness and healthy nutrition.  Colonoscopy 2015, planning Cologuard this year.  2. Postmenopausal hormone replacement therapy Postmenopausal, well on hormone replacement therapy.  Will decrease estradiol patch to 0.0375 weekly.  Prometrium 200 mg daily at bedtime.  No contraindication to continue on hormone replacement therapy.  Prescriptions sent to pharmacy.  Recommend vitamin D supplements, calcium intake of 1200 mg daily and regular weightbearing physical activities.  Will schedule bone density in October 2020.  3. Family history of breast cancer in sister No genetic testing done.  Referred patient to [facility] Counsel[facility].  Other orders - cholecalciferol (VITAMIN D3) 25 MCG (1000 UT) tablet; Take 1,000 Units by mouth daily. - progesterone (PROMETRIUM) 200 MG capsule; Take 1 capsule (200 mg total) by mouth at bedtime. - estradiol (CLIMARA - DOSED IN MG/24 HR) 0.0375 mg/24hr patch; Place 1 patch (0.0375 mg total) onto the skin once a week.  Princess Bruins MD, 8:14 AM 05/15/2019

## 2019-05-15 NOTE — Telephone Encounter (Signed)
-----   Message from Princess Bruins, MD sent at 05/15/2019  8:48 AM EDT ----- Regarding: Refer for Genetic counseling Sister with Breast Cancer.

## 2019-05-15 NOTE — Telephone Encounter (Signed)
Referral placed at [facility] for the below they will call to schedule.

## 2019-05-17 NOTE — Patient Instructions (Signed)
1. Well female exam with routine gynecological exam Normal gynecologic exam.  Pap test July 2019 was negative, no indication to repeat this year.  Breast exam normal.  Screening mammogram 2019 was normal per patient will obtain report and she will switch to the breast center for her screening mammograms.  Health labs with family physician.  Good body mass index at 24.91.  Continue with fitness and healthy nutrition.  Colonoscopy 2015, planning Cologuard this year.  2. Postmenopausal hormone replacement therapy Postmenopausal, well on hormone replacement therapy.  Will decrease estradiol patch to 0.0375 weekly.  Prometrium 200 mg daily at bedtime.  No contraindication to continue on hormone replacement therapy.  Prescriptions sent to pharmacy.  Recommend vitamin D supplements, calcium intake of 1200 mg daily and regular weightbearing physical activities.  Will schedule bone density in October 2020.  3. Family history of breast cancer in sister No genetic testing done.  Referred patient to [facility] Counsel[facility].  Other orders - cholecalciferol (VITAMIN D3) 25 MCG (1000 UT) tablet; Take 1,000 Units by mouth daily. - progesterone (PROMETRIUM) 200 MG capsule; Take 1 capsule (200 mg total) by mouth at bedtime. - estradiol (CLIMARA - DOSED IN MG/24 HR) 0.0375 mg/24hr patch; Place 1 patch (0.0375 mg total) onto the skin once a week.  Laura Pearson, it was a pleasure seeing you today!

## 2019-06-03 NOTE — Telephone Encounter (Signed)
Genetics left message for patient to call.

## 2019-06-12 ENCOUNTER — Telehealth: Payer: Self-pay | Admitting: Genetic Counselor

## 2019-06-12 NOTE — Telephone Encounter (Signed)
Ms. Wollin returned my call to schedule a genetic counseling appt w/Karen Florene Glen on 10/28 at [place] been made aware to arrive 15 minutes early.

## 2019-06-25 ENCOUNTER — Encounter: Payer: Self-pay | Admitting: Gastroenterology

## 2019-06-30 ENCOUNTER — Ambulatory Visit
Admission: RE | Admit: 2019-06-30 | Discharge: 2019-06-30 | Disposition: A | Discharge status: Home / Self Care | Payer: 59 | Source: Ambulatory Visit | Attending: Family Medicine | Admitting: Family Medicine

## 2019-06-30 ENCOUNTER — Ambulatory Visit
Admission: RE | Admit: 2019-06-30 | Discharge: 2019-06-30 | Disposition: A | Discharge status: Home / Self Care | Payer: 59 | Source: Ambulatory Visit | Attending: Obstetrics & Gynecology | Admitting: Obstetrics & Gynecology

## 2019-06-30 ENCOUNTER — Other Ambulatory Visit: Payer: Self-pay

## 2019-06-30 DIAGNOSIS — E2839 Other primary ovarian failure: Secondary | ICD-10-CM

## 2019-06-30 DIAGNOSIS — Z1231 Encounter for screening mammogram for malignant neoplasm of breast: Secondary | ICD-10-CM

## 2019-07-14 ENCOUNTER — Telehealth: Payer: Self-pay | Admitting: Genetic Counselor

## 2019-07-14 NOTE — Telephone Encounter (Signed)
Called patient regarding upcoming [facility] appointment, left a voicemail. Let a voicemail. Walk-in visit.

## 2019-07-15 ENCOUNTER — Other Ambulatory Visit: Payer: Self-pay | Admitting: Genetic Counselor

## 2019-07-15 ENCOUNTER — Inpatient Hospital Stay: Payer: 59

## 2019-07-15 ENCOUNTER — Inpatient Hospital Stay: Payer: 59 | Attending: Genetic Counselor | Admitting: Genetic Counselor

## 2019-07-15 ENCOUNTER — Other Ambulatory Visit: Payer: Self-pay

## 2019-07-15 ENCOUNTER — Encounter: Payer: Self-pay | Admitting: Genetic Counselor

## 2019-07-15 DIAGNOSIS — Z8042 Family history of malignant neoplasm of prostate: Secondary | ICD-10-CM | POA: Insufficient documentation

## 2019-07-15 DIAGNOSIS — Z1371 Encounter for nonprocreative screening for genetic disease carrier status: Secondary | ICD-10-CM | POA: Diagnosis not present

## 2019-07-15 DIAGNOSIS — Z803 Family history of malignant neoplasm of breast: Secondary | ICD-10-CM | POA: Insufficient documentation

## 2019-07-15 DIAGNOSIS — Z808 Family history of malignant neoplasm of other organs or systems: Secondary | ICD-10-CM | POA: Insufficient documentation

## 2019-07-15 NOTE — Progress Notes (Signed)
REFERRING PROVIDER: Princess Bruins, MD [address] [place],  [place] [address]  PRIMARY PROVIDER:  Hayden Rasmussen, MD  PRIMARY REASON FOR VISIT:  1. Family history of breast cancer   2. Family history of prostate cancer   3. Family history of melanoma      HISTORY OF PRESENT ILLNESS:   [name], a 58 y.o. female, was seen for a [facility] cancer genetics consultation at the request of Dr. Dellis Filbert due to a family history of cancer.  Laura Pearson presents to clinic today to discuss the possibility of a hereditary predisposition to cancer, genetic testing, and to further clarify her future cancer risks, as well as potential cancer risks for family members.   Laura Pearson is a 58 y.o. female with no personal history of cancer.  She is concerned for a hereditary cancer syndrome due to the family history of cancer.  CANCER HISTORY:  Oncology History   No history exists.     RISK FACTORS:  Menarche was at age 18-17.  First live birth at age 22.  OCP use for approximately 10+ years.  Ovaries intact: yes.  Hysterectomy: no.  Menopausal status: postmenopausal.  HRT use: 8+ years. Colonoscopy: yes; normal. Mammogram within the last year: yes. Number of breast biopsies: 0. Up to date with pelvic exams: yes. Any excessive radiation exposure in the past: no  Past Medical History:  Diagnosis Date  . Family history of breast cancer   . Family history of melanoma   . Family history of prostate cancer     Past Surgical History:  Procedure Laterality Date  . cryocutery  1989  . DILATION AND CURETTAGE OF UTERUS  05/11/1999   TAB  . LIPOMA EXCISION  2001    Social History   Socioeconomic History  . Marital status: Married    Spouse name: Not on file  . Number of children: Not on file  . Years of education: Not on file  . Highest education level: Not on file  Occupational History  . Not on file  Social Needs  . Financial resource strain: Not on file  . Food  insecurity    Worry: Not on file    Inability: Not on file  . Transportation needs    Medical: Not on file    Non-medical: Not on file  Tobacco Use  . Smoking status: Never Smoker  . Smokeless tobacco: Never Used  Substance and Sexual Activity  . Alcohol use: Yes    Alcohol/week: 2.0 standard drinks    Types: 2 Cans of beer per week  . Drug use: No  . Sexual activity: Not Currently    Partners: Male    Comment: 1ST INTERCOURSE- 17, PARTNERS- 5  Lifestyle  . Physical activity    Days per week: Not on file    Minutes per session: Not on file  . Stress: Not on file  Relationships  . Social Herbalist on phone: Not on file    Gets together: Not on file    Attends religious service: Not on file    Active member of club or organization: Not on file    Attends meetings of clubs or organizations: Not on file    Relationship status: Not on file  Other Topics Concern  . Not on file  Social History Narrative  . Not on file     FAMILY HISTORY:  We obtained a detailed, 4-generation family history.  Significant diagnoses are  listed below: Family History  Problem Relation Age of Onset  . Heart failure Mother   . Cancer Father        PROSTATE  . Heart disease Maternal Grandmother   . Hypertension Maternal Grandmother   . Cancer Maternal Grandfather        LUNG  . Sudden death Paternal Grandfather   . Heart disease Paternal Grandfather   . Breast cancer Sister 13  . Osteopenia Sister   . Diabetes Paternal Grandmother   . Diabetes Paternal Aunt   . Diabetes Paternal Uncle   . Melanoma Nephew 27  . Colon cancer Neg Hx     The patient has a son and daughter who are cancer free.  She has a brother and sister.  Her sister was diagnosed with breast cancer at 50.  She has a son who was diagnosed with melanoma at 62.  Both parents are deceased.  The patient's father was diagnosed with prostate cancer in his 74's-60's.  He died over memorial day due to Diabetes and not  seeing an MD due to COVID prevalence.  He had a full sister who is living at 20.  He had two maternal half sisters and two maternal half brothers, one brother and sister died of Diabetes.  His mother died of diabetes, his father died young due to a sudden death.    The patient's mother died from Amaloidosis and heart failure.  She had one brother who is cancer free.  Both maternal grandparents are deceased.  The grandfather had lung cancer and the grandmother died of heart disease.  Laura Pearson is unaware of previous family history of genetic testing for hereditary cancer risks. Patient's maternal ancestors are of [place] and [place] descent, and paternal ancestors are of [place] and [place] descent. There is no reported Ashkenazi Jewish ancestry. There is no known consanguinity.  GENETIC COUNSELING ASSESSMENT: Laura Pearson is a 59 y.o. female with a family history of cancer which is somewhat suggestive of a hereditary cancer syndrome and predisposition to cancer given the combination of cancers in the family. We, therefore, discussed and recommended the following at today's visit.   DISCUSSION: We discussed that 5 - 10% of breast cancer is hereditary, with most cases associated with BRCA mutations.  There are other genes that can be associated with hereditary breast cancer syndromes.  These include ATM, CHEK2 and PALB2. There are other genes associated with hereditary melanoma cases, due to her nephew being diagnosed so young.  We discussed that testing is beneficial for several reasons including knowing how to follow individuals after completing their treatment, identifying whether potential treatment options such as PARP inhibitors would be beneficial, and understand if other family members could be at risk for cancer and allow them to undergo genetic testing.   We reviewed the characteristics, features and inheritance patterns of hereditary cancer syndromes. We also discussed genetic testing, including the  appropriate family members to test, the process of testing, insurance coverage and turn-around-time for results. We discussed the implications of a negative, positive, carrier and/or variant of uncertain significant result. We recommended Ms. Battie pursue genetic testing for the multi cancer gene panel. The Multi-Cancer Gene Panel offered by [facility] includes sequencing and/or deletion duplication testing of the following 85 genes: AIP, ALK, APC, ATM, AXIN2,BAP1,  BARD1, BLM, BMPR1A, BRCA1, BRCA2, BRIP1, CASR, CDC73, CDH1, CDK4, CDKN1B, CDKN1C, CDKN2A (p14ARF), CDKN2A (p16INK4a), CEBPA, CHEK2, CTNNA1, DICER1, DIS3L2, EGFR (c.2369C>T, p.Thr790Met variant only), EPCAM (Deletion/duplication testing only), FH, FLCN, GATA2, GPC3,  GREM1 (Promoter region deletion/duplication testing only), HOXB13 (c.251G>A, p.Gly84Glu), HRAS, KIT, MAX, MEN1, MET, MITF (c.952G>A, p.Glu318Lys variant only), MLH1, MSH2, MSH3, MSH6, MUTYH, NBN, NF1, NF2, NTHL1, PALB2, PDGFRA, PHOX2B, PMS2, POLD1, POLE, POT1, PRKAR1A, PTCH1, PTEN, RAD50, RAD51C, RAD51D, RB1, RECQL4, RET, RNF43, RUNX1, SDHAF2, SDHA (sequence changes only), SDHB, SDHC, SDHD, SMAD4, SMARCA4, SMARCB1, SMARCE1, STK11, SUFU, TERC, TERT, TMEM127, TP53, TSC1, TSC2, VHL, WRN and WT1.    Based on Ms. Haverland's family history of cancer, she meets medical criteria for genetic testing. Despite that she meets criteria, she may still have an out of pocket cost. We discussed that if her out of pocket cost for testing is over $100, the laboratory will call and confirm whether she wants to proceed with testing.  If the out of pocket cost of testing is less than $100 she will be billed by the genetic testing laboratory.   We discussed that some people do not want to undergo genetic testing due to fear of genetic discrimination.  A federal law called the Genetic Information Non-Discrimination Act (GINA) of 2008 helps protect individuals against genetic discrimination based on their genetic test  results.  It impacts both health insurance and employment.  With health insurance, it protects against increased premiums, being kicked off insurance or being forced to take a test in order to be insured.  For employment it protects against hiring, firing and promoting decisions based on genetic test results.  Health status due to a cancer diagnosis is not protected under GINA.   PLAN: After considering the risks, benefits, and limitations, Ms. Boehle provided informed consent to pursue genetic testing and the blood sample was sent to [facility] for analysis of the Multi-cancer panel. Results should be available within approximately 2-3 weeks' time, at which point they will be disclosed by telephone to Ms. Terrones, as will any additional recommendations warranted by these results. Ms. Herbison will receive a summary of her genetic counseling visit and a copy of her results once available. This information will also be available in Epic.   Lastly, we encouraged Ms. Helwig to remain in contact with cancer genetics annually so that we can continuously update the family history and inform her of any changes in cancer genetics and testing that may be of benefit for this family.   Ms. Sheu questions were answered to her satisfaction today. Our contact information was provided should additional questions or concerns arise. Thank you for the referral and allowing [place] to share in the care of your patient.   Amear Strojny P. Florene Glen, [place], [facility] Licensed, Insurance risk surveyor [email or web address] phone: [phone]  The patient was seen for a total of 60 minutes in face-to-face genetic counseling.  This patient was discussed with Drs. Magrinat, Lindi Adie and/or Burr Medico who agrees with the above.    _______________________________________________________________________ For Office Staff:  Number of people involved in session: 1 Was an Intern/ student involved with case: no

## 2019-07-28 ENCOUNTER — Telehealth: Payer: Self-pay | Admitting: Genetic Counselor

## 2019-07-28 ENCOUNTER — Encounter: Payer: Self-pay | Admitting: Genetic Counselor

## 2019-07-28 DIAGNOSIS — Z1379 Encounter for other screening for genetic and chromosomal anomalies: Secondary | ICD-10-CM | POA: Insufficient documentation

## 2019-07-28 NOTE — Telephone Encounter (Signed)
LM on home and mobile VM that results are back and to please call.  Left CB instructions.

## 2019-07-29 NOTE — Telephone Encounter (Signed)
Revealed negative genetic testing.  Discussed that we do not know why there is cancer in the family. It could be due to a different gene that we are not testing, or maybe our current technology may not be able to pick something up.  It will be important for her to keep in contact with genetics to keep up with whether additional testing may be needed.  

## 2019-07-30 ENCOUNTER — Ambulatory Visit: Payer: Self-pay | Admitting: Genetic Counselor

## 2019-07-30 ENCOUNTER — Encounter: Payer: Self-pay | Admitting: Genetic Counselor

## 2019-07-30 DIAGNOSIS — Z1379 Encounter for other screening for genetic and chromosomal anomalies: Secondary | ICD-10-CM

## 2019-07-30 NOTE — Progress Notes (Signed)
HPI:  Ms. Fenderson was previously seen in the [facility] clinic due to a family history of cancer and concerns regarding a hereditary predisposition to cancer. Please refer to our prior cancer genetics clinic note for more information regarding our discussion, assessment and recommendations, at the time. Ms. Garlitz recent genetic test results were disclosed to her, as were recommendations warranted by these results. These results and recommendations are discussed in more detail below.  CANCER HISTORY:  Oncology History   No history exists.    FAMILY HISTORY:  We obtained a detailed, 4-generation family history.  Significant diagnoses are listed below: Family History  Problem Relation Age of Onset  . Heart failure Mother   . Cancer Father        PROSTATE  . Heart disease Maternal Grandmother   . Hypertension Maternal Grandmother   . Cancer Maternal Grandfather        LUNG  . Sudden death Paternal Grandfather   . Heart disease Paternal Grandfather   . Breast cancer Sister 42  . Osteopenia Sister   . Diabetes Paternal Grandmother   . Diabetes Paternal Aunt   . Diabetes Paternal Uncle   . Melanoma Nephew 27  . Colon cancer Neg Hx     The patient has a son and daughter who are cancer free.  She has a brother and sister.  Her sister was diagnosed with breast cancer at 33.  She has a son who was diagnosed with melanoma at 34.  Both parents are deceased.  The patient's father was diagnosed with prostate cancer in his 48's-60's.  He died over memorial day due to Diabetes and not seeing an MD due to COVID prevalence.  He had a full sister who is living at 43.  He had two maternal half sisters and two maternal half brothers, one brother and sister died of Diabetes.  His mother died of diabetes, his father died young due to a sudden death.    The patient's mother died from Amaloidosis and heart failure.  She had one brother who is cancer free.  Both maternal grandparents are  deceased.  The grandfather had lung cancer and the grandmother died of heart disease.  Ms. Harnack is unaware of previous family history of genetic testing for hereditary cancer risks. Patient's maternal ancestors are of [place] and [place] descent, and paternal ancestors are of [place] and [place] descent. There is no reported Ashkenazi Jewish ancestry. There is no known consanguinity.    GENETIC TEST RESULTS: Genetic testing reported out on July 28, 2019 through the multi-cancer panel found no pathogenic mutations. The Multi-Gene Panel offered by [facility] includes sequencing and/or deletion duplication testing of the following 85 genes: AIP, ALK, APC, ATM, AXIN2,BAP1,  BARD1, BLM, BMPR1A, BRCA1, BRCA2, BRIP1, CASR, CDC73, CDH1, CDK4, CDKN1B, CDKN1C, CDKN2A (p14ARF), CDKN2A (p16INK4a), CEBPA, CHEK2, CTNNA1, DICER1, DIS3L2, EGFR (c.2369C>T, p.Thr790Met variant only), EPCAM (Deletion/duplication testing only), FH, FLCN, GATA2, GPC3, GREM1 (Promoter region deletion/duplication testing only), HOXB13 (c.251G>A, p.Gly84Glu), HRAS, KIT, MAX, MEN1, MET, MITF (c.952G>A, p.Glu318Lys variant only), MLH1, MSH2, MSH3, MSH6, MUTYH, NBN, NF1, NF2, NTHL1, PALB2, PDGFRA, PHOX2B, PMS2, POLD1, POLE, POT1, PRKAR1A, PTCH1, PTEN, RAD50, RAD51C, RAD51D, RB1, RECQL4, RET, RNF43, RUNX1, SDHAF2, SDHA (sequence changes only), SDHB, SDHC, SDHD, SMAD4, SMARCA4, SMARCB1, SMARCE1, STK11, SUFU, TERC, TERT, TMEM127, TP53, TSC1, TSC2, VHL, WRN and WT1.  The test report has been scanned into EPIC and is located under the Molecular Pathology section of the Results Review tab.  A portion of the  result report is included below for reference.     We discussed with Ms. Bayly that because current genetic testing is not perfect, it is possible there may be a gene mutation in one of these genes that current testing cannot detect, but that chance is small.  We also discussed, that there could be another gene that has not yet been discovered, or that we  have not yet tested, that is responsible for the cancer diagnoses in the family. It is also possible there is a hereditary cause for the cancer in the family that Ms. Shough did not inherit and therefore was not identified in her testing.  Therefore, it is important to remain in touch with cancer genetics in the future so that we can continue to offer Ms. Bastidas the most up to date genetic testing.   ADDITIONAL GENETIC TESTING: We discussed with Ms. Caruthers that her genetic testing was fairly extensive.  If there are genes identified to increase cancer risk that can be analyzed in the future, we would be happy to discuss and coordinate this testing at that time.    CANCER SCREENING RECOMMENDATIONS: Ms. Hebdon test result is considered negative (normal).  This means that we have not identified a hereditary cause for her family history of cancer at this time. Most cancers happen by chance and this negative test suggests that her cancer may fall into this category.    While reassuring, this does not definitively rule out a hereditary predisposition to cancer. It is still possible that there could be genetic mutations that are undetectable by current technology. There could be genetic mutations in genes that have not been tested or identified to increase cancer risk.  Therefore, it is recommended she continue to follow the cancer management and screening guidelines provided by her primary healthcare provider.   An individual's cancer risk and medical management are not determined by genetic test results alone. Overall cancer risk assessment incorporates additional factors, including personal medical history, family history, and any available genetic information that may result in a personalized plan for cancer prevention and surveillance  RECOMMENDATIONS FOR FAMILY MEMBERS:  Individuals in this family might be at some increased risk of developing cancer, over the general population risk, simply due to the family  history of cancer.  We recommended women in this family have a yearly mammogram beginning at age 12, or 93 years younger than the earliest onset of cancer, an annual clinical breast exam, and perform monthly breast self-exams. Women in this family should also have a gynecological exam as recommended by their primary provider. All family members should have a colonoscopy by age 16.  FOLLOW-UP: Lastly, we discussed with Ms. Shearman that cancer genetics is a rapidly advancing field and it is possible that new genetic tests will be appropriate for her and/or her family members in the future. We encouraged her to remain in contact with cancer genetics on an annual basis so we can update her personal and family histories and let her know of advances in cancer genetics that may benefit this family.   Our contact number was provided. Ms. Haye questions were answered to her satisfaction, and she knows she is welcome to call us at anytime with additional questions or concerns.   Roma Kayser, [place], [facility] Licensed, Certified Genetic Counselor [email or web address]

## 2019-12-24 ENCOUNTER — Telehealth: Payer: Self-pay | Admitting: *Deleted

## 2019-12-24 NOTE — Telephone Encounter (Signed)
Patient called and left message in triage voicemail requesting e-mail exchange with Dr.Lavoie to update health. I called and received patient voicemail, I left detailed message on cell she can schedule OV, or sign up for my chart or video visit with provider.

## 2020-01-16 HISTORY — PX: KNEE SURGERY: SHX244

## 2020-04-19 ENCOUNTER — Other Ambulatory Visit: Payer: Self-pay | Admitting: Physical Medicine and Rehabilitation

## 2020-04-19 DIAGNOSIS — M546 Pain in thoracic spine: Secondary | ICD-10-CM

## 2020-05-16 ENCOUNTER — Ambulatory Visit (INDEPENDENT_AMBULATORY_CARE_PROVIDER_SITE_OTHER): Payer: 59 | Admitting: Obstetrics & Gynecology

## 2020-05-16 ENCOUNTER — Encounter: Payer: Self-pay | Admitting: Obstetrics & Gynecology

## 2020-05-16 ENCOUNTER — Other Ambulatory Visit: Payer: Self-pay

## 2020-05-16 VITALS — BP 102/70 | Ht 63.5 in | Wt 118.0 lb

## 2020-05-16 DIAGNOSIS — Z79890 Hormone replacement therapy (postmenopausal): Secondary | ICD-10-CM | POA: Diagnosis not present

## 2020-05-16 DIAGNOSIS — Z01419 Encounter for gynecological examination (general) (routine) without abnormal findings: Secondary | ICD-10-CM

## 2020-05-16 DIAGNOSIS — F5101 Primary insomnia: Secondary | ICD-10-CM

## 2020-05-16 DIAGNOSIS — Z803 Family history of malignant neoplasm of breast: Secondary | ICD-10-CM | POA: Diagnosis not present

## 2020-05-16 DIAGNOSIS — Z1379 Encounter for other screening for genetic and chromosomal anomalies: Secondary | ICD-10-CM

## 2020-05-16 MED ORDER — ESTRADIOL 0.1 MG/24HR TD PTWK: 0.1000 mg | MEDICATED_PATCH | TRANSDERMAL | 4 refills | Status: DC

## 2020-05-16 MED ORDER — ESZOPICLONE 2 MG PO TABS: 2.0000 mg | ORAL_TABLET | Freq: Every evening | ORAL | 5 refills | Status: DC | PRN

## 2020-05-16 MED ORDER — PROGESTERONE 200 MG PO CAPS: 200.0000 mg | ORAL_CAPSULE | Freq: Every day | ORAL | 4 refills | Status: DC

## 2020-05-16 NOTE — Addendum Note (Signed)
Addended by: Thurnell Garbe A on: 05/16/2020 02:05 PM   Modules accepted: Orders

## 2020-05-16 NOTE — Progress Notes (Signed)
Laura Pearson 09/16/1961 [id number]   History:    59 y.o.  [id number] Married. 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 Daughter has a 45 yo son.  LK:[id number] presenting for annual gyn exam   LXB:WIOMBTDHR, well on HRT with Climara 0.075 and Prometrium 200 HS. No PMB. No pelvic pain.  Abstinent.Urine and bowel movements normal. Breasts normal.  Sister with Breast Ca at 14 yo, no genetic testing done. Body mass index decreased to 20.57.  Father died of Ketoacidosis, so making efforts to reduce carbs.  Walking.  Just received a CS injection in the left knee for Arthritis.  Good HBA1C at 5.5 with Fam MD. Well on Lunesta. Anxiety when traveling, Xanax helps.Fasting health labs with Dr Darron Doom.   Past medical history,surgical history, family history and social history were all reviewed and documented in the EPIC chart.  Gynecologic History No LMP recorded. Patient is postmenopausal.  Obstetric History OB History  Gravida Para Term Preterm AB Living  $Remov'4 2     1 2  'JDDkjO$ SAB TAB Ectopic Multiple Live Births  1            # Outcome Date GA Lbr Len/2nd Weight Sex Delivery Anes PTL Lv  4 Gravida           3 SAB           2 Para           1 Para              ROS: A ROS was performed and pertinent positives and negatives are included in the history.  GENERAL: No fevers or chills. HEENT: No change in vision, no earache, sore throat or sinus congestion. NECK: No pain or stiffness. CARDIOVASCULAR: No chest pain or pressure. No palpitations. PULMONARY: No shortness of breath, cough or wheeze. GASTROINTESTINAL: No abdominal pain, nausea, vomiting or diarrhea, melena or bright red blood per rectum. GENITOURINARY: No urinary frequency, urgency, hesitancy or dysuria. MUSCULOSKELETAL: No joint or muscle pain, no back pain, no recent trauma. DERMATOLOGIC: No rash, no itching, no lesions. ENDOCRINE: No polyuria, polydipsia, no heat or cold intolerance. No recent change in  weight. HEMATOLOGICAL: No anemia or easy bruising or bleeding. NEUROLOGIC: No headache, seizures, numbness, tingling or weakness. PSYCHIATRIC: No depression, no loss of interest in normal activity or change in sleep pattern.     Exam:   BP 102/70   Ht 5' 3.5" (1.613 m)   Wt 118 lb (53.5 kg)   BMI 20.57 kg/m   Body mass index is 20.57 kg/m.  General appearance : Well developed well nourished female. No acute distress HEENT: Eyes: no retinal hemorrhage or exudates,  Neck supple, trachea midline, no carotid bruits, no thyroidmegaly Lungs: Clear to auscultation, no rhonchi or wheezes, or rib retractions  Heart: Regular rate and rhythm, no murmurs or gallops Breast:Examined in sitting and supine position were symmetrical in appearance, no palpable masses or tenderness,  no skin retraction, no nipple inversion, no nipple discharge, no skin discoloration, no axillary or supraclavicular lymphadenopathy Abdomen: no palpable masses or tenderness, no rebound or guarding Extremities: no edema or skin discoloration or tenderness  Pelvic: Vulva: Normal             Vagina: No gross lesions or discharge  Cervix: No gross lesions or discharge.  Pap reflex done.  Uterus  AV, normal size, shape and consistency, non-tender and mobile  Adnexa  Without masses or tenderness  Anus: Normal   Assessment/Plan:  59 y.o. female for annual exam   1. Encounter for routine gynecological examination with Papanicolaou smear of cervix Normal gynecologic exam.  Pap reflex done today.  Breast exam normal.  Screening mammogram October 2020 was negative.  Good body mass index at 20.57.  Continue with fitness and healthy nutrition.  Health labs with family physician.  2. Postmenopausal hormone replacement therapy Well on HRT, but still having hot flushes.  Decision to increase the estradiol patch to 0.1 weekly.  Continue Prometrium 200 mg at bedtime.  Taking 200 mg instead of 100 mg because it helps with sleep.  No  contraindication to continue on hormone replacement therapy.  Prescriptions sent to pharmacy.  3. Primary insomnia Well on Lunesta.  Prescription sent to pharmacy.  4. Family history of breast cancer Sister with Breast Ca at age 35.  BRCA 1-2 negative for patient.  5. Genetic testing Patient had negative genetic testing results.  Other orders - Zoledronic Acid (RECLAST IV); Inject into the vein. - Calcium Carbonate-Vit D-Min (CALCIUM 1200 PO); Take by mouth. - gabapentin (NEURONTIN) 300 MG capsule; Take 300 mg by mouth 2 (two) times daily. - eszopiclone (LUNESTA) 2 MG TABS tablet; Take 1 tablet (2 mg total) by mouth at bedtime as needed for sleep. Take immediately before bedtime - progesterone (PROMETRIUM) 200 MG capsule; Take 1 capsule (200 mg total) by mouth at bedtime. - estradiol (CLIMARA - DOSED IN MG/24 HR) 0.1 mg/24hr patch; Place 1 patch (0.1 mg total) onto the skin once a week.  Princess Bruins MD, 11:19 AM 05/16/2020

## 2020-05-17 LAB — PAP IG W/ RFLX HPV ASCU

## 2020-05-30 ENCOUNTER — Other Ambulatory Visit: Payer: Self-pay | Admitting: Obstetrics & Gynecology

## 2020-05-30 ENCOUNTER — Other Ambulatory Visit: Payer: Self-pay

## 2020-05-30 ENCOUNTER — Ambulatory Visit
Admission: RE | Admit: 2020-05-30 | Discharge: 2020-05-30 | Disposition: A | Discharge status: Home / Self Care | Payer: 59 | Source: Ambulatory Visit | Attending: Physical Medicine and Rehabilitation | Admitting: Physical Medicine and Rehabilitation

## 2020-05-30 DIAGNOSIS — M546 Pain in thoracic spine: Secondary | ICD-10-CM

## 2020-05-30 DIAGNOSIS — Z1231 Encounter for screening mammogram for malignant neoplasm of breast: Secondary | ICD-10-CM

## 2020-07-06 ENCOUNTER — Other Ambulatory Visit: Payer: Self-pay

## 2020-07-06 ENCOUNTER — Ambulatory Visit
Admission: RE | Admit: 2020-07-06 | Discharge: 2020-07-06 | Disposition: A | Discharge status: Home / Self Care | Payer: 59 | Source: Ambulatory Visit | Attending: Obstetrics & Gynecology | Admitting: Obstetrics & Gynecology

## 2020-07-06 DIAGNOSIS — Z1231 Encounter for screening mammogram for malignant neoplasm of breast: Secondary | ICD-10-CM

## 2020-07-11 ENCOUNTER — Telehealth: Payer: Self-pay

## 2020-07-11 MED ORDER — ESZOPICLONE 3 MG PO TABS: 3.0000 mg | ORAL_TABLET | Freq: Every day | ORAL | 3 refills | Status: DC

## 2020-07-11 NOTE — Telephone Encounter (Signed)
Prescription sent

## 2020-07-11 NOTE — Telephone Encounter (Addendum)
Patient called because she said at [facility] Dr. Marguerita Merles agreed to take over prescribed her Lunesta 3 mg. For her. She is asking if Rx can be sent to [facility].   I see that back at her Aug visit Dr. Marguerita Merles prescribed Lunesta 2 mg for her but it went to [facility] order but was set on phone in so never actually reached the pharmacy.  Dr. Marguerita Merles, if you are okay prescribing the 3 mg  Please indicate refills and sign Rx. Thanks!

## 2020-07-12 NOTE — Telephone Encounter (Signed)
Informed patient Rx has been sent.

## 2020-07-13 ENCOUNTER — Other Ambulatory Visit: Payer: Self-pay | Admitting: Obstetrics & Gynecology

## 2020-07-18 DIAGNOSIS — M546 Pain in thoracic spine: Secondary | ICD-10-CM | POA: Diagnosis not present

## 2020-08-08 DIAGNOSIS — M5134 Other intervertebral disc degeneration, thoracic region: Secondary | ICD-10-CM | POA: Diagnosis not present

## 2021-01-09 ENCOUNTER — Other Ambulatory Visit: Payer: Self-pay | Admitting: Obstetrics & Gynecology

## 2021-01-10 NOTE — Telephone Encounter (Signed)
Medication refill request: [email or web address]  Last AEX:  05-16-20 ML Next AEX: not scheduled Last MMG (if hormonal medication request): n/a Refill authorized: Today, please advise.   Medication pended for #30, 5RF. Please refill if appropriate.

## 2021-01-23 DIAGNOSIS — Z96652 Presence of left artificial knee joint: Secondary | ICD-10-CM | POA: Diagnosis not present

## 2021-02-24 DIAGNOSIS — M810 Age-related osteoporosis without current pathological fracture: Secondary | ICD-10-CM | POA: Diagnosis not present

## 2021-02-24 DIAGNOSIS — R5383 Other fatigue: Secondary | ICD-10-CM | POA: Diagnosis not present

## 2021-02-24 DIAGNOSIS — Z23 Encounter for immunization: Secondary | ICD-10-CM | POA: Diagnosis not present

## 2021-02-24 DIAGNOSIS — E559 Vitamin D deficiency, unspecified: Secondary | ICD-10-CM | POA: Diagnosis not present

## 2021-02-24 DIAGNOSIS — M2550 Pain in unspecified joint: Secondary | ICD-10-CM | POA: Diagnosis not present

## 2021-02-24 DIAGNOSIS — G4700 Insomnia, unspecified: Secondary | ICD-10-CM | POA: Diagnosis not present

## 2021-02-24 DIAGNOSIS — Z0001 Encounter for general adult medical examination with abnormal findings: Secondary | ICD-10-CM | POA: Diagnosis not present

## 2021-03-09 DIAGNOSIS — D539 Nutritional anemia, unspecified: Secondary | ICD-10-CM | POA: Diagnosis not present

## 2021-06-12 ENCOUNTER — Other Ambulatory Visit: Payer: Self-pay | Admitting: Obstetrics & Gynecology

## 2021-06-12 DIAGNOSIS — Z1231 Encounter for screening mammogram for malignant neoplasm of breast: Secondary | ICD-10-CM

## 2021-06-27 ENCOUNTER — Encounter: Payer: Self-pay | Admitting: Gastroenterology

## 2021-06-30 ENCOUNTER — Other Ambulatory Visit: Payer: Self-pay | Admitting: Obstetrics & Gynecology

## 2021-07-03 NOTE — Telephone Encounter (Signed)
Annual exam scheduled on 07/28/21

## 2021-07-08 ENCOUNTER — Other Ambulatory Visit: Payer: Self-pay | Admitting: Obstetrics & Gynecology

## 2021-07-10 NOTE — Telephone Encounter (Signed)
AEX 05/16/20 Scheduled AEX 07/28/21

## 2021-07-12 ENCOUNTER — Other Ambulatory Visit: Payer: Self-pay

## 2021-07-12 ENCOUNTER — Ambulatory Visit
Admission: RE | Admit: 2021-07-12 | Discharge: 2021-07-12 | Disposition: A | Discharge status: Home / Self Care | Payer: BC Managed Care – PPO | Source: Ambulatory Visit | Attending: Obstetrics & Gynecology | Admitting: Obstetrics & Gynecology

## 2021-07-12 DIAGNOSIS — Z1231 Encounter for screening mammogram for malignant neoplasm of breast: Secondary | ICD-10-CM | POA: Diagnosis not present

## 2021-07-28 ENCOUNTER — Ambulatory Visit (INDEPENDENT_AMBULATORY_CARE_PROVIDER_SITE_OTHER): Payer: BC Managed Care – PPO | Admitting: Obstetrics & Gynecology

## 2021-07-28 ENCOUNTER — Telehealth: Payer: Self-pay

## 2021-07-28 ENCOUNTER — Other Ambulatory Visit: Payer: Self-pay

## 2021-07-28 ENCOUNTER — Encounter: Payer: Self-pay | Admitting: Obstetrics & Gynecology

## 2021-07-28 VITALS — BP 110/70 | HR 79 | Resp 16 | Ht 63.25 in | Wt 121.0 lb

## 2021-07-28 DIAGNOSIS — Z01419 Encounter for gynecological examination (general) (routine) without abnormal findings: Secondary | ICD-10-CM | POA: Diagnosis not present

## 2021-07-28 DIAGNOSIS — Z79890 Hormone replacement therapy (postmenopausal): Secondary | ICD-10-CM | POA: Diagnosis not present

## 2021-07-28 DIAGNOSIS — F40243 Fear of flying: Secondary | ICD-10-CM

## 2021-07-28 DIAGNOSIS — Z803 Family history of malignant neoplasm of breast: Secondary | ICD-10-CM

## 2021-07-28 DIAGNOSIS — M810 Age-related osteoporosis without current pathological fracture: Secondary | ICD-10-CM

## 2021-07-28 MED ORDER — CLONAZEPAM 0.5 MG PO TABS: 0.5000 mg | ORAL_TABLET | Freq: Every day | ORAL | 0 refills | Status: DC | PRN

## 2021-07-28 MED ORDER — PROGESTERONE 200 MG PO CAPS: 200.0000 mg | ORAL_CAPSULE | Freq: Every day | ORAL | 4 refills | Status: DC

## 2021-07-28 MED ORDER — ESTRADIOL 0.1 MG/24HR TD PTWK: 0.1000 mg | MEDICATED_PATCH | TRANSDERMAL | 4 refills | Status: DC

## 2021-07-28 NOTE — Telephone Encounter (Signed)
Once you speak with patient, can you route to Dr. Dellis Filbert so she can switch her prometrium dosage to 100mg .  I will be out of the office on Monday.  Thanks

## 2021-07-28 NOTE — Telephone Encounter (Signed)
Per Dr Dellis Filbert, she is aware that the patient has been doing well on the 200mg  for a while, however she would like to decrease her now to 100mg  to decrease her risk of breast stimuli/breast problems. She feels the 100mg  will still protect her uterus & that she will still do well on this dosage now. Please call patient with this information.  Thanks.

## 2021-07-28 NOTE — Telephone Encounter (Signed)
Patient called back and I informed her with the below note. Joy patient asked why did Dr.Lavoie tell you this? Patient reports she has been doing well on this dose for years. She is wanting know the reason why the change. I am not sure if Dr.Lavoie told you reason why.

## 2021-07-28 NOTE — Telephone Encounter (Signed)
Left message for patient to callback. Per Dr Dellis Filbert, she would like  to decrease patients prometrium dosage from 200mg  to 100mg .

## 2021-07-28 NOTE — Telephone Encounter (Signed)
Left message for patient to call.

## 2021-07-28 NOTE — Progress Notes (Signed)
Laura Pearson Sep 30, 1960 [id number]   History:    60 y.o. [id number] Married. Patient is a Management consultant.  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  Daughter has a 18 yo son.   RP:  Established patient presenting for annual gyn exam    HPI: Postmenopause, well on HRT with Climara 0.1 and Prometrium 200 HS.  No PMB.  No pelvic pain.  Abstinent.  Pap Neg 2021.  Urine and bowel movements normal.  Breasts normal.  Sister with Breast Ca at 51 yo. Patient's genetic testing Negative.  Screening mammo Neg 06/2021.  Body mass index decreased to 21.27.  Father died of Ketoacidosis, so making efforts to reduce carbs.  Walking, biking.  Had Lt knee replacement in 01/2020.  Osteoporosis T-Score -2.5 at [facility] 06/2019.  No Reclast x 2021.  Repeat BD at [facility] now.  Well on Lunesta for insomnia.  Anxiety when traveling, Clonazepam helps.  Fasting health labs with Dr Darron Doom.  Cologuard Neg.    Past medical history,surgical history, family history and social history were all reviewed and documented in the EPIC chart.  Gynecologic History No LMP recorded. Patient is postmenopausal.  Obstetric History OB History  Gravida Para Term Preterm AB Living  4 2     1 2   SAB IAB Ectopic Multiple Live Births  1            # Outcome Date GA Lbr Len/2nd Weight Sex Delivery Anes PTL Lv  4 Gravida           3 SAB           2 Para           1 Para              ROS: A ROS was performed and pertinent positives and negatives are included in the history.  GENERAL: No fevers or chills. HEENT: No change in vision, no earache, sore throat or sinus congestion. NECK: No pain or stiffness. CARDIOVASCULAR: No chest pain or pressure. No palpitations. PULMONARY: No shortness of breath, cough or wheeze. GASTROINTESTINAL: No abdominal pain, nausea, vomiting or diarrhea, melena or bright red blood per rectum. GENITOURINARY: No urinary frequency, urgency, hesitancy or dysuria. MUSCULOSKELETAL: No joint or muscle pain, no back pain, no  recent trauma. DERMATOLOGIC: No rash, no itching, no lesions. ENDOCRINE: No polyuria, polydipsia, no heat or cold intolerance. No recent change in weight. HEMATOLOGICAL: No anemia or easy bruising or bleeding. NEUROLOGIC: No headache, seizures, numbness, tingling or weakness. PSYCHIATRIC: No depression, no loss of interest in normal activity or change in sleep pattern.     Exam:   BP 110/70   Pulse 79   Resp 16   Ht 5' 3.25" (1.607 m)   Wt 121 lb (54.9 kg)   BMI 21.27 kg/m   Body mass index is 21.27 kg/m.  General appearance : Well developed well nourished female. No acute distress HEENT: Eyes: no retinal hemorrhage or exudates,  Neck supple, trachea midline, no carotid bruits, no thyroidmegaly Lungs: Clear to auscultation, no rhonchi or wheezes, or rib retractions  Heart: Regular rate and rhythm, no murmurs or gallops Breast:Examined in sitting and supine position were symmetrical in appearance, no palpable masses or tenderness,  no skin retraction, no nipple inversion, no nipple discharge, no skin discoloration, no axillary or supraclavicular lymphadenopathy Abdomen: no palpable masses or tenderness, no rebound or guarding Extremities: no edema or skin discoloration or tenderness  Pelvic: Vulva: Normal  Vagina: No gross lesions or discharge  Cervix: No gross lesions or discharge  Uterus  AV, normal size, shape and consistency, non-tender and mobile  Adnexa  Without masses or tenderness  Anus: Normal   Assessment/Plan:  60 y.o. female for annual exam   1. Well female exam with routine gynecological exam Postmenopause, well on HRT with Climara 0.1 and Prometrium 200 HS.  No PMB.  No pelvic pain.  Abstinent.  Pap Neg 2021.  Urine and bowel movements normal.  Breasts normal.  Sister with Breast Ca at 42 yo. Patient's genetic testing Negative.  Screening mammo Neg 06/2021.  Body mass index decreased to 21.27.  Father died of Ketoacidosis, so making efforts to reduce carbs.   Walking, biking.  Had Lt knee replacement in 01/2020.  Osteoporosis T-Score -2.5 at [facility] 06/2019.  No Reclast x 2021.  Repeat BD at [facility] now.  Well on Lunesta for insomnia.  Anxiety when traveling, Clonazepam helps.  Fasting health labs with Dr Darron Doom.  Cologuard Neg.   2. Postmenopausal hormone replacement therapy Postmenopause, well on HRT with Climara 0.1 and Prometrium 200 HS.  No PMB.   3. Age-related osteoporosis without current pathological fracture  Osteoporosis T-Score -2.5 at [facility] 06/2019.  No Reclast x 2021.  Repeat BD at [facility] now.   - DG Bone Density; Future  4. Anxiety with flying Clonazepam PRN prescribed.  5. Family history of breast cancer Genetic screening Neg.  Other orders - clonazePAM (KLONOPIN) 0.5 MG tablet; Take 1 tablet (0.5 mg total) by mouth daily as needed for anxiety. - estradiol (CLIMARA - DOSED IN MG/24 HR) 0.1 mg/24hr patch; Place 1 patch (0.1 mg total) onto the skin once a week. - progesterone (PROMETRIUM) 200 MG capsule; Take 1 capsule (200 mg total) by mouth at bedtime.   Princess Bruins MD, 8:37 AM 07/28/2021

## 2021-07-31 NOTE — Telephone Encounter (Signed)
Voicemail not set up on home preferred number.

## 2021-08-01 MED ORDER — PROGESTERONE MICRONIZED 100 MG PO CAPS: 100.0000 mg | ORAL_CAPSULE | Freq: Every day | ORAL | 4 refills | Status: DC

## 2021-08-01 NOTE — Telephone Encounter (Signed)
Patient informed, Rx sent.  

## 2021-08-09 ENCOUNTER — Other Ambulatory Visit: Payer: Self-pay | Admitting: Obstetrics & Gynecology

## 2021-08-09 NOTE — Telephone Encounter (Signed)
Annual exam was on 07/28/21

## 2021-10-22 ENCOUNTER — Encounter: Payer: Self-pay | Admitting: Obstetrics & Gynecology

## 2021-11-30 ENCOUNTER — Encounter: Payer: Self-pay | Admitting: Obstetrics & Gynecology

## 2021-11-30 ENCOUNTER — Ambulatory Visit: Payer: BC Managed Care – PPO | Admitting: Obstetrics & Gynecology

## 2021-11-30 ENCOUNTER — Other Ambulatory Visit: Payer: Self-pay

## 2021-11-30 VITALS — BP 120/70 | HR 68 | Resp 16

## 2021-11-30 DIAGNOSIS — N950 Postmenopausal bleeding: Secondary | ICD-10-CM

## 2021-11-30 DIAGNOSIS — N6315 Unspecified lump in the right breast, overlapping quadrants: Secondary | ICD-10-CM | POA: Diagnosis not present

## 2021-11-30 DIAGNOSIS — F5101 Primary insomnia: Secondary | ICD-10-CM | POA: Diagnosis not present

## 2021-11-30 DIAGNOSIS — Z79890 Hormone replacement therapy (postmenopausal): Secondary | ICD-10-CM | POA: Diagnosis not present

## 2021-11-30 MED ORDER — ESZOPICLONE 3 MG PO TABS: 3.0000 mg | ORAL_TABLET | Freq: Every day | ORAL | 5 refills | Status: DC

## 2021-11-30 NOTE — Progress Notes (Signed)
? ? ?  Laura Pearson 11/17/1960 [id number] ? ? ?     62 y.o.  [id number]  ? ?RP: PMB on HRT ? ?HPI: Started having light PMB when changed the Prometrium from 200 mg HS to 100 mg HS.  Decided to go back to 200 mg of Prometrium HS and the spotting stopped.  No pelvic pain.  Vasomotor menopausal Sxs well controled on Estradiol 0.1 patch.  C/O Rt breast pain and tenderness x 2 weeks.  No breast lump felt.  Last Mammo Neg 06/2021.  Needs Lunesta for insomnia. ? ? ?OB History  ?Gravida Para Term Preterm AB Living  ?'4 2     1 2  '$ ?SAB IAB Ectopic Multiple Live Births  ?1          ?  ?# Outcome Date GA Lbr Len/2nd Weight Sex Delivery Anes PTL Lv  ?4 Gravida           ?3 SAB           ?2 Para           ?1 Para           ? ? ?Past medical history,surgical history, problem list, medications, allergies, family history and social history were all reviewed and documented in the EPIC chart. ? ? ?Directed ROS with pertinent positives and negatives documented in the history of present illness/assessment and plan. ? ?Exam: ? ?Vitals:  ? 11/30/21 0930  ?BP: 120/70  ?Pulse: 68  ?Resp: 16  ? ?General appearance:  Normal ? ?Breast exam:  Dense breasts bilaterally.  Rt breast mass felt about 2 x 3 cm, mobile, tender at 6 O'Clock, close to the nipple. ? ?Abdomen: Normal ? ?Gynecologic exam: Vulva normal.  Speculum:  Cervix/vagina normal.  No bleeding. ? ? ?Assessment/Plan:  61 y.o. [id number]  ? ?1. Postmenopausal bleeding ?Started having light PMB when changed the Prometrium from 200 mg HS to 100 mg HS.  Decided to go back to 200 mg of Prometrium HS and the spotting stopped.  No pelvic pain.  Vasomotor menopausal Sxs well controled on Estradiol 0.1 patch.  R/O Endometrial pathology with Pelvic US at f/u. ?- US Transvaginal Non-OB; Future ? ?2. Postmenopausal hormone replacement therapy ?Continue on HRT with Estradiol patch 0.1 and Prometrium 200 mg HS. ? ?3. Breast lump on right side at 6 o'clock position ?C/O Rt breast pain and tenderness x 2  weeks.  No breast lump felt.  Last Mammo Neg 06/2021.  Breast exam: Dense breasts bilaterally.  Rt breast mass felt about 2 x 3 cm, mobile, tender at 6 O'Clock, close to the nipple.  Scheduling Rt Dx mammo/US. ? ?4. Primary insomnia ?Needs Lunesta for insomnia.  Alternative ways to manage insomnia known.  Will use Lunesta with moderation.  Prescription sent to pharmacy. ?  ?Other orders ?- progesterone (PROMETRIUM) 200 MG capsule; Take 200 mg by mouth daily. ?- Eszopiclone 3 MG TABS; Take 1 tablet (3 mg total) by mouth at bedtime. Take immediately before bedtime  ? ?Counseling on PMB, Rt Breast lump and insomnia x 30 minutes. ? ?Princess Bruins MD, 10:04 AM 11/30/2021 ? ? ? ?  ?

## 2021-12-01 ENCOUNTER — Telehealth: Payer: Self-pay | Admitting: *Deleted

## 2021-12-01 DIAGNOSIS — N6315 Unspecified lump in the right breast, overlapping quadrants: Secondary | ICD-10-CM

## 2021-12-01 NOTE — Telephone Encounter (Signed)
-----   Message from Princess Bruins, MD sent at 11/30/2021 10:11 AM EDT ----- ?Regarding: Schedule a Rt Dx mammo/Breast US at the [facility] ?2 x 3 cm  tender, mobile mass at 6 O'Clock, close to nipple.  Tender x 2 weeks. ? ?

## 2021-12-01 NOTE — Telephone Encounter (Signed)
Orders placed at the breast center. Patient scheduled on 12/15/21 arrival at 1:50pm for 2:00pm appointment.  ?

## 2021-12-05 NOTE — Telephone Encounter (Signed)
Patient rescheduled her appointment to 01/03/22. ?

## 2021-12-08 ENCOUNTER — Encounter: Payer: Self-pay | Admitting: Obstetrics & Gynecology

## 2021-12-15 ENCOUNTER — Other Ambulatory Visit: Payer: BC Managed Care – PPO

## 2021-12-26 DIAGNOSIS — K130 Diseases of lips: Secondary | ICD-10-CM | POA: Diagnosis not present

## 2021-12-26 DIAGNOSIS — R609 Edema, unspecified: Secondary | ICD-10-CM | POA: Diagnosis not present

## 2021-12-26 DIAGNOSIS — F4320 Adjustment disorder, unspecified: Secondary | ICD-10-CM | POA: Diagnosis not present

## 2021-12-26 DIAGNOSIS — B009 Herpesviral infection, unspecified: Secondary | ICD-10-CM | POA: Diagnosis not present

## 2021-12-28 ENCOUNTER — Encounter: Payer: Self-pay | Admitting: Obstetrics & Gynecology

## 2021-12-28 ENCOUNTER — Ambulatory Visit (INDEPENDENT_AMBULATORY_CARE_PROVIDER_SITE_OTHER): Payer: BC Managed Care – PPO

## 2021-12-28 ENCOUNTER — Ambulatory Visit (INDEPENDENT_AMBULATORY_CARE_PROVIDER_SITE_OTHER): Payer: BC Managed Care – PPO | Admitting: Obstetrics & Gynecology

## 2021-12-28 VITALS — BP 110/70

## 2021-12-28 DIAGNOSIS — N950 Postmenopausal bleeding: Secondary | ICD-10-CM | POA: Diagnosis not present

## 2021-12-28 DIAGNOSIS — Z79890 Hormone replacement therapy (postmenopausal): Secondary | ICD-10-CM

## 2021-12-28 NOTE — Progress Notes (Signed)
? ? ?  Laura Pearson 1960-10-14 [id number] ? ? ?     61 y.o.  [id number] Married. ? ?RP: PMB on HRT for Pelvic US ? ?HPI: Started having light PMB when changed the Prometrium from 200 mg HS to 100 mg HS.  Decided to go back to 200 mg of Prometrium HS and the spotting stopped.  No pelvic pain. Vasomotor menopausal Sxs well controled on Estradiol 0.1 patch weekly.  ? ? ?OB History  ?Gravida Para Term Preterm AB Living  ?'4 2     2 2  '$ ?SAB IAB Ectopic Multiple Live Births  ?1 1        ?  ?# Outcome Date GA Lbr Len/2nd Weight Sex Delivery Anes PTL Lv  ?4 IAB           ?3 SAB           ?2 Para           ?1 Para           ? ? ?Past medical history,surgical history, problem list, medications, allergies, family history and social history were all reviewed and documented in the EPIC chart. ? ? ?Directed ROS with pertinent positives and negatives documented in the history of present illness/assessment and plan. ? ?Exam: ? ?Vitals:  ? 12/28/21 0833  ?BP: 110/70  ? ?General appearance:  Normal ? ?Pelvic US today: T/V images.  Anteverted uterus normal in size and shape with no myometrial mass.  The uterus is measured at 7.41 x 5.08 x 4.01 cm.  The endometrial lining is measured at 5.14 mm within normal on hormone replacement therapy, with no mass or increase blood flow with Doppler.  Both ovaries are atrophic in appearance, normal.  Positive perfusion to both ovaries.  No adnexal mass.  No free fluid in the pelvis. ? ? ?Assessment/Plan:  61 y.o. [id number]  ? ?1. Postmenopausal bleeding ?Started having light PMB when changed the Prometrium from 200 mg HS to 100 mg HS.  Decided to go back to 200 mg of Prometrium HS and the spotting stopped.  No pelvic pain.  Vasomotor menopausal Sxs well controled on Estradiol 0.1 patch weekly.  Pelvic US findings thoroughly reviewed.  Normal endometrial lining at 5.14 mm on hormone replacement therapy, with no increased blood flow with Doppler and no lesion or mass seen.  Given that patient is not having  any postmenopausal bleeding since she was back on 200 mg of Prometrium at bedtime, decision to continue with that regimen.  Patient needs estradiol 0.1 patch weekly to control her severe menopausal symptoms.  Patient reassured.  Given reassurance, declines endometrial biopsy today.  Postmenopausal bleeding precautions reviewed and will be reassessed if starts bleeding again.  Would proceed with an endometrial biopsy and a repeat ultrasound in that case. ? ?2. Postmenopausal hormone replacement therapy ?Well on Estradiol 0.1 patch weekly with Prometrium 200 mg PO HS. ? ?Other orders ?- predniSONE (DELTASONE) 10 MG tablet; Take by mouth. ?- valACYclovir (VALTREX) 1000 MG tablet; Take 1,000 mg by mouth 3 (three) times daily.  ? ?Princess Bruins MD, 9:02 AM 12/28/2021 ? ? ? ?  ?

## 2022-01-03 ENCOUNTER — Ambulatory Visit
Admission: RE | Admit: 2022-01-03 | Discharge: 2022-01-03 | Disposition: A | Discharge status: Home / Self Care | Payer: BC Managed Care – PPO | Source: Ambulatory Visit | Attending: Obstetrics & Gynecology | Admitting: Obstetrics & Gynecology

## 2022-01-03 DIAGNOSIS — N6315 Unspecified lump in the right breast, overlapping quadrants: Secondary | ICD-10-CM

## 2022-01-03 DIAGNOSIS — N644 Mastodynia: Secondary | ICD-10-CM | POA: Diagnosis not present

## 2022-01-06 DIAGNOSIS — B009 Herpesviral infection, unspecified: Secondary | ICD-10-CM | POA: Diagnosis not present

## 2022-01-11 ENCOUNTER — Ambulatory Visit
Admission: RE | Admit: 2022-01-11 | Discharge: 2022-01-11 | Disposition: A | Discharge status: Home / Self Care | Payer: BC Managed Care – PPO | Source: Ambulatory Visit | Attending: Obstetrics & Gynecology | Admitting: Obstetrics & Gynecology

## 2022-01-11 DIAGNOSIS — Z780 Asymptomatic menopausal state: Secondary | ICD-10-CM | POA: Diagnosis not present

## 2022-01-11 DIAGNOSIS — M8588 Other specified disorders of bone density and structure, other site: Secondary | ICD-10-CM | POA: Diagnosis not present

## 2022-01-11 DIAGNOSIS — M810 Age-related osteoporosis without current pathological fracture: Secondary | ICD-10-CM

## 2022-01-25 DIAGNOSIS — B009 Herpesviral infection, unspecified: Secondary | ICD-10-CM | POA: Diagnosis not present

## 2022-01-27 ENCOUNTER — Encounter: Payer: Self-pay | Admitting: Obstetrics & Gynecology

## 2022-02-05 DIAGNOSIS — K130 Diseases of lips: Secondary | ICD-10-CM | POA: Diagnosis not present

## 2022-02-05 DIAGNOSIS — F4320 Adjustment disorder, unspecified: Secondary | ICD-10-CM | POA: Diagnosis not present

## 2022-02-05 DIAGNOSIS — B009 Herpesviral infection, unspecified: Secondary | ICD-10-CM | POA: Diagnosis not present

## 2022-02-05 DIAGNOSIS — M810 Age-related osteoporosis without current pathological fracture: Secondary | ICD-10-CM | POA: Diagnosis not present

## 2022-03-27 DIAGNOSIS — M79661 Pain in right lower leg: Secondary | ICD-10-CM | POA: Diagnosis not present

## 2022-03-27 DIAGNOSIS — M79605 Pain in left leg: Secondary | ICD-10-CM | POA: Diagnosis not present

## 2022-03-27 DIAGNOSIS — M79604 Pain in right leg: Secondary | ICD-10-CM | POA: Diagnosis not present

## 2022-03-27 DIAGNOSIS — M25561 Pain in right knee: Secondary | ICD-10-CM | POA: Diagnosis not present

## 2022-04-05 DIAGNOSIS — K130 Diseases of lips: Secondary | ICD-10-CM | POA: Diagnosis not present

## 2022-06-05 ENCOUNTER — Telehealth: Payer: Self-pay | Admitting: *Deleted

## 2022-06-05 NOTE — Telephone Encounter (Signed)
-----   Message from Leron Croak, [place] sent at 06/04/2022  2:02 PM EDT ----- Regarding: Reclast This patient called to schedule her annual exam. She has a question regarding her Reclast. She requested a call back.  Thanks

## 2022-06-05 NOTE — Telephone Encounter (Signed)
Left message regarding questions pertaining  to reclast

## 2022-06-08 ENCOUNTER — Other Ambulatory Visit: Payer: Self-pay | Admitting: Obstetrics & Gynecology

## 2022-06-08 NOTE — Telephone Encounter (Signed)
Last AEX 07/28/2021--scheduled for 08/17/2022

## 2022-06-13 ENCOUNTER — Other Ambulatory Visit: Payer: Self-pay | Admitting: Obstetrics & Gynecology

## 2022-06-13 DIAGNOSIS — Z1231 Encounter for screening mammogram for malignant neoplasm of breast: Secondary | ICD-10-CM

## 2022-06-13 NOTE — Telephone Encounter (Signed)
Pt called asking question regarding Reclast. I relayed Dr. Dellis Filbert my chart message. Make appt to discuss osteoporosis. Pt declined visit and will call DR. [place] office for recommendations.

## 2022-07-09 DIAGNOSIS — M810 Age-related osteoporosis without current pathological fracture: Secondary | ICD-10-CM | POA: Diagnosis not present

## 2022-07-09 DIAGNOSIS — E559 Vitamin D deficiency, unspecified: Secondary | ICD-10-CM | POA: Diagnosis not present

## 2022-07-13 ENCOUNTER — Ambulatory Visit
Admission: RE | Admit: 2022-07-13 | Discharge: 2022-07-13 | Disposition: A | Discharge status: Home / Self Care | Payer: BC Managed Care – PPO | Source: Ambulatory Visit | Attending: Obstetrics & Gynecology | Admitting: Obstetrics & Gynecology

## 2022-07-13 DIAGNOSIS — Z1231 Encounter for screening mammogram for malignant neoplasm of breast: Secondary | ICD-10-CM

## 2022-08-17 ENCOUNTER — Ambulatory Visit (INDEPENDENT_AMBULATORY_CARE_PROVIDER_SITE_OTHER): Payer: BC Managed Care – PPO | Admitting: Obstetrics & Gynecology

## 2022-08-17 ENCOUNTER — Encounter: Payer: Self-pay | Admitting: Obstetrics & Gynecology

## 2022-08-17 ENCOUNTER — Other Ambulatory Visit (HOSPITAL_COMMUNITY)
Admission: RE | Admit: 2022-08-17 | Discharge: 2022-08-17 | Disposition: A | Discharge status: Home / Self Care | Payer: BC Managed Care – PPO | Source: Ambulatory Visit | Attending: Obstetrics & Gynecology | Admitting: Obstetrics & Gynecology

## 2022-08-17 VITALS — BP 108/70 | HR 71 | Ht 62.75 in | Wt 121.0 lb

## 2022-08-17 DIAGNOSIS — F5101 Primary insomnia: Secondary | ICD-10-CM

## 2022-08-17 DIAGNOSIS — F40243 Fear of flying: Secondary | ICD-10-CM

## 2022-08-17 DIAGNOSIS — Z01419 Encounter for gynecological examination (general) (routine) without abnormal findings: Secondary | ICD-10-CM

## 2022-08-17 DIAGNOSIS — Z79890 Hormone replacement therapy (postmenopausal): Secondary | ICD-10-CM

## 2022-08-17 MED ORDER — CLONAZEPAM 0.5 MG PO TABS: 0.5000 mg | ORAL_TABLET | Freq: Every day | ORAL | 0 refills | Status: DC | PRN

## 2022-08-17 MED ORDER — ESZOPICLONE 3 MG PO TABS: ORAL_TABLET | ORAL | 5 refills | Status: DC

## 2022-08-17 MED ORDER — ESTRADIOL 0.1 MG/24HR TD PTWK: 0.1000 mg | MEDICATED_PATCH | TRANSDERMAL | 4 refills | Status: DC

## 2022-08-17 MED ORDER — PROGESTERONE 200 MG PO CAPS: 200.0000 mg | ORAL_CAPSULE | Freq: Every day | ORAL | 4 refills | Status: DC

## 2022-08-17 NOTE — Progress Notes (Signed)
Laura Pearson [birth year] [id number]   History:    61 y.o. [id number] Married. 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  Daughter has a 33 yo son.   RP:  Established patient presenting for annual gyn exam    HPI: Postmenopause, well on HRT with Climara 0.1 and Prometrium 200 HS.  No PMB.  No pelvic pain.  Abstinent.  Pap Neg 04/2020.  Urine and bowel movements normal.  Breasts normal.  Sister with Breast Ca at 59 yo. Patient's genetic testing Negative.  Screening mammo Neg 07/13/2022.  Body mass index 21.61. Walking, biking.  Had Lt knee replacement in 01/2020.  H/O Osteoporosis, no Reclast x 2021. BD at [facility] 01/11/22 Osteoporosis stable with T-Score -2.6 at the Rt Fem Neck. Vit D normal at 45.6 on 07/09/22.  Well on Lunesta for insomnia.  Anxiety, Clonazepam helps.  Fasting health labs with Dr Darron Doom.  Cologuard Neg. Flu vaccine done at work.   Past medical history,surgical history, family history and social history were all reviewed and documented in the EPIC chart.  Gynecologic History No LMP recorded. Patient is postmenopausal.  Obstetric History OB History  Gravida Para Term Preterm AB Living  '4 2 2   2 2  '$ SAB IAB Ectopic Multiple Live Births  1 1          # Outcome Date GA Lbr Len/2nd Weight Sex Delivery Anes PTL Lv  4 IAB           3 SAB           2 Term           1 Term              ROS: A ROS was performed and pertinent positives and negatives are included in the history. GENERAL: No fevers or chills. HEENT: No change in vision, no earache, sore throat or sinus congestion. NECK: No pain or stiffness. CARDIOVASCULAR: No chest pain or pressure. No palpitations. PULMONARY: No shortness of breath, cough or wheeze. GASTROINTESTINAL: No abdominal pain, nausea, vomiting or diarrhea, melena or bright red blood per rectum. GENITOURINARY: No urinary frequency, urgency, hesitancy or dysuria. MUSCULOSKELETAL: No joint or muscle pain, no back pain, no recent trauma.  DERMATOLOGIC: No rash, no itching, no lesions. ENDOCRINE: No polyuria, polydipsia, no heat or cold intolerance. No recent change in weight. HEMATOLOGICAL: No anemia or easy bruising or bleeding. NEUROLOGIC: No headache, seizures, numbness, tingling or weakness. PSYCHIATRIC: No depression, no loss of interest in normal activity or change in sleep pattern.     Exam:   BP 108/70   Pulse 71   Ht 5' 2.75" (1.594 m)   Wt 121 lb (54.9 kg)   SpO2 97%   BMI 21.61 kg/m   Body mass index is 21.61 kg/m.  General appearance : Well developed well nourished female. No acute distress HEENT: Eyes: no retinal hemorrhage or exudates,  Neck supple, trachea midline, no carotid bruits, no thyroidmegaly Lungs: Clear to auscultation, no rhonchi or wheezes, or rib retractions  Heart: Regular rate and rhythm, no murmurs or gallops Breast:Examined in sitting and supine position were symmetrical in appearance, no palpable masses or tenderness,  no skin retraction, no nipple inversion, no nipple discharge, no skin discoloration, no axillary or supraclavicular lymphadenopathy Abdomen: no palpable masses or tenderness, no rebound or guarding Extremities: no edema or skin discoloration or tenderness  Pelvic: Vulva: Normal             Vagina: No gross  lesions or discharge  Cervix: No gross lesions or discharge.  Pap reflex done.  Uterus  AV, normal size, shape and consistency, non-tender and mobile  Adnexa  Without masses or tenderness  Anus: Normal   Assessment/Plan:  61 y.o. female for annual exam   1. Encounter for routine gynecological examination with Papanicolaou smear of cervix Postmenopause, well on HRT with Climara 0.1 and Prometrium 200 HS. No PMB.  No pelvic pain.  Abstinent.  Pap Neg 04/2020.  Urine and bowel movements normal.  Breasts normal.  Sister with Breast Ca at 47 yo. Patient's genetic testing Negative.  Screening mammo Neg 07/13/2022.  Body mass index 21.61. Walking, biking.  Had Lt knee  replacement in 01/2020.  H/O Osteoporosis, no Reclast x 2021. BD at [facility] 01/11/22 Osteoporosis stable with T-Score -2.6 at the Rt Fem Neck. Vit D normal at 45.6 on 07/09/22.  Well on Lunesta for insomnia.  Anxiety, Clonazepam helps.  Fasting health labs with Dr Darron Doom.  Cologuard Neg. Flu vaccine done at work. - Cytology - PAP( [place])  2. Postmenopausal hormone replacement therapy Postmenopause, well on HRT with Climara 0.1 and Prometrium 200 HS. No PMB.  No pelvic pain.  Abstinent.  No CI to continue on HRT, prescriptions sent.  3. Primary insomnia Well on Lunesta.  Prescription sent to pharmacy.  4. Anxiety with flying Helped with Clonazepam as needed.  Prescription sent to pharmacy.  Other orders - progesterone (PROMETRIUM) 200 MG capsule; Take 1 capsule (200 mg total) by mouth at bedtime. - clonazePAM (KLONOPIN) 0.5 MG tablet; Take 1 tablet (0.5 mg total) by mouth daily as needed for anxiety. - estradiol (CLIMARA - DOSED IN MG/24 HR) 0.1 mg/24hr patch; Place 1 patch (0.1 mg total) onto the skin once a week. - Eszopiclone 3 MG TABS; TAKE 1 TABLET BY MOUTH AT BEDTIME TAKE IMMEDIATELY BEFORE BEDTIME   Princess Bruins MD, 4:12 PM

## 2022-08-21 LAB — CYTOLOGY - PAP: Diagnosis: NEGATIVE

## 2022-08-29 DIAGNOSIS — M810 Age-related osteoporosis without current pathological fracture: Secondary | ICD-10-CM | POA: Diagnosis not present

## 2022-08-29 DIAGNOSIS — E559 Vitamin D deficiency, unspecified: Secondary | ICD-10-CM | POA: Diagnosis not present

## 2022-10-03 DIAGNOSIS — M810 Age-related osteoporosis without current pathological fracture: Secondary | ICD-10-CM | POA: Diagnosis not present

## 2022-10-06 ENCOUNTER — Other Ambulatory Visit: Payer: Self-pay | Admitting: Obstetrics & Gynecology

## 2022-10-09 NOTE — Telephone Encounter (Signed)
FYI. Last AEX 08/17/2022--nothing scheduled but recall is in place.   Pharmacy reports last fill on 09/06/2022.  Rx was sent on 08/17/2022 for #30 w/ 5 refills.   Contacted pharmacy and they reported that they have refills on file for pt. Will refuse this request and note for pt to contact us if/when she needs refills.

## 2023-02-28 DIAGNOSIS — M810 Age-related osteoporosis without current pathological fracture: Secondary | ICD-10-CM | POA: Diagnosis not present

## 2023-03-05 DIAGNOSIS — M810 Age-related osteoporosis without current pathological fracture: Secondary | ICD-10-CM | POA: Diagnosis not present

## 2023-03-05 DIAGNOSIS — E559 Vitamin D deficiency, unspecified: Secondary | ICD-10-CM | POA: Diagnosis not present

## 2023-03-07 ENCOUNTER — Other Ambulatory Visit: Payer: Self-pay

## 2023-03-07 DIAGNOSIS — F5101 Primary insomnia: Secondary | ICD-10-CM

## 2023-03-07 MED ORDER — ESZOPICLONE 3 MG PO TABS: ORAL_TABLET | ORAL | 5 refills | Status: DC

## 2023-03-07 NOTE — Telephone Encounter (Signed)
Pt LVM in triage line stating she needs refill on on lunesta.   Last AEX 08/17/2022--recall placed for 08/2023.

## 2023-03-07 NOTE — Telephone Encounter (Signed)
Pt notified by DVM per DPR.

## 2023-03-18 ENCOUNTER — Encounter: Payer: Self-pay | Admitting: Obstetrics & Gynecology

## 2023-06-20 ENCOUNTER — Other Ambulatory Visit: Payer: Self-pay | Admitting: Obstetrics and Gynecology

## 2023-06-20 DIAGNOSIS — Z1231 Encounter for screening mammogram for malignant neoplasm of breast: Secondary | ICD-10-CM

## 2023-08-01 ENCOUNTER — Ambulatory Visit
Admission: RE | Admit: 2023-08-01 | Discharge: 2023-08-01 | Disposition: A | Discharge status: Home / Self Care | Payer: BC Managed Care – PPO | Source: Ambulatory Visit | Attending: Obstetrics and Gynecology | Admitting: Obstetrics and Gynecology

## 2023-08-01 DIAGNOSIS — Z1231 Encounter for screening mammogram for malignant neoplasm of breast: Secondary | ICD-10-CM

## 2023-08-19 ENCOUNTER — Ambulatory Visit: Payer: BC Managed Care – PPO | Admitting: Obstetrics and Gynecology

## 2023-08-20 NOTE — Progress Notes (Signed)
62 y.o. [id number] Widow Caucasian female here for annual exam.    Patient is on HRT.  Does not remember when she went through menopause.  She wants to continue her HRT.   Hx HSV.   Hx osteoporosis.   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  Alfonso Patten is helpful to provide good quality sleep so she can work well during the day.  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  Husband passed this year after a long illness.  PCP: Dois Davenport, MD   No LMP recorded. Patient is postmenopausal.           Sexually active: No.  The current method of family planning is post menopausal status.    Menopausal hormone therapy:  climara patch and progesterone Exercising: Yes.     walking Smoker:  no  OB History  Gravida Para Term Preterm AB Living  4 2 2  2 2   SAB IAB Ectopic Multiple Live Births  1 1       # Outcome Date GA Lbr Len/2nd Weight Sex Type Anes PTL Lv  4 IAB           3 SAB           2 Term           1 Term              HEALTH MAINTENANCE: Last 2 paps:  08/17/22 neg, 05/16/20 neg History of abnormal Pap or positive HPV:  yes, many years ago in her 32s.  Mammogram:   08/01/23 Breast Density Cat C, BI-RADS CAT 1 neg Colonoscopy:  08/06/14.  Cologuard with her PCP.  Bone Density:  01/11/22  Result  osteoporosis.  Due in April 2025.   [facility] prescribing yearly Reclast.      There is no immunization history on file for this patient.    reports that she has never smoked. She has never used smokeless tobacco. She reports current alcohol use of about 2.0 standard drinks of alcohol per week. She reports that she does not use drugs.  Past Medical History:  Diagnosis Date   Family history of breast cancer    Family history of melanoma    Family history of prostate cancer    HSV infection    oral    Past Surgical History:  Procedure Laterality Date   cryocutery  84   DILATION AND CURETTAGE OF UTERUS  05/11/1999   TAB   KNEE SURGERY Left 01/2020    LIPOMA EXCISION  2001    Current Outpatient Medications  Medication Sig Dispense Refill   Calcium Carbonate-Vit D-Min (CALCIUM 1200 PO) Take by mouth.     cholecalciferol (VITAMIN D3) 25 MCG (1000 UT) tablet Take 1,000 Units by mouth daily.     clonazePAM (KLONOPIN) 0.5 MG tablet Take 1 tablet (0.5 mg total) by mouth daily as needed for anxiety. 8 tablet 0   estradiol (CLIMARA - DOSED IN MG/24 HR) 0.1 mg/24hr patch Place 1 patch (0.1 mg total) onto the skin once a week. 12 patch 4   Eszopiclone 3 MG TABS TAKE 1 TABLET BY MOUTH AT BEDTIME TAKE IMMEDIATELY BEFORE BEDTIME 30 tablet 5   Multiple Vitamins-Minerals (WOMENS MULTI VITAMIN & MINERAL PO) Take 1 tablet by mouth daily.     Omega-3 Fatty Acids (FISH OIL) 1000 MG CAPS Take by mouth.     progesterone (PROMETRIUM) 200 MG capsule Take 1 capsule (200 mg total) by mouth at bedtime.  90 capsule 4   No current facility-administered medications for this visit.    ALLERGIES: Hydrocodone  Family History  Problem Relation Age of Onset   Heart failure Mother    Prostate cancer Father    Breast cancer Sister 67   Osteopenia Sister    Diabetes Paternal Aunt    Diabetes Paternal Uncle    Heart disease Maternal Grandmother    Hypertension Maternal Grandmother    Lung cancer Maternal Grandfather    Diabetes Paternal Grandmother    Sudden death Paternal Grandfather    Heart disease Paternal Grandfather    Melanoma Nephew 27    Review of Systems  All other systems reviewed and are negative.   PHYSICAL EXAM:  BP (!) 112/52 (BP Location: Left Arm, Patient Position: Sitting, Cuff Size: Small)   Pulse 68   Ht 5\' 3"  (1.6 m)   Wt 124 lb (56.2 kg)   SpO2 96%   BMI 21.97 kg/m     General appearance: alert, cooperative and appears stated age Head: normocephalic, without obvious abnormality, atraumatic Neck: no adenopathy, supple, symmetrical, trachea midline and thyroid normal to inspection and palpation Lungs: clear to auscultation  bilaterally Breasts: right: normal appearance, mass 1.5 - 2 cm at 6:00 which is nontender, No nipple retraction or dimpling, No nipple discharge or bleeding, No axillary adenopathy Left:  normal appearance, no masses or tenderness, No nipple retraction or dimpling, No nipple discharge or bleeding, No axillary adenopathy Heart: regular rate and rhythm Abdomen: soft, non-tender; no masses, no organomegaly Extremities: extremities normal, atraumatic, no cyanosis or edema Skin: skin color, texture, turgor normal. No rashes or lesions Lymph nodes: cervical, supraclavicular, and axillary nodes normal. Neurologic: grossly normal  Pelvic: External genitalia:  no lesions              No abnormal inguinal nodes palpated.              Urethra:  normal appearing urethra with no masses, tenderness or lesions              Bartholins and Skenes: normal                 Vagina: normal appearing vagina with normal color and discharge, no lesions              Cervix: no lesions              Pap taken: No. Bimanual Exam:  Uterus:  normal size, contour, position, consistency, mobility, non-tender              Adnexa: no mass, fullness, tenderness              Rectal exam: Yes.  .  Confirms.              Anus:  normal sphincter tone, no lesions  Chaperone was present for exam:  Warren Lacy, CMA  ASSESSMENT: Well woman visit with gynecologic exam HRT.  FH breast cancer in sister.   Personal negative genetic testing.  Right breast mass 1.5 - 2 cm at 6:00.  Seems to be persistent.  Prior imaging benign. HSV I. Insomnia and difficulty flying.   PLAN: Mammogram screening discussed. Dx right mammogram and right breast US at [facility].  Self breast awareness reviewed. Pap and HRV collected:  No. Guidelines for Calcium, Vitamin D, regular exercise program including cardiovascular and weight bearing exercise. Discused WHI and use of HRT which can increase risk of PE, DVT, MI, stroke and  breast cancer.   Medication refills:  HRT, Lunesta, Clonazepam. Follow up:  1 year and prn.

## 2023-09-03 ENCOUNTER — Encounter: Payer: Self-pay | Admitting: Obstetrics and Gynecology

## 2023-09-03 ENCOUNTER — Ambulatory Visit: Payer: BC Managed Care – PPO | Admitting: Obstetrics and Gynecology

## 2023-09-03 VITALS — BP 112/52 | HR 68 | Ht 63.0 in | Wt 124.0 lb

## 2023-09-03 DIAGNOSIS — Z79890 Hormone replacement therapy (postmenopausal): Secondary | ICD-10-CM

## 2023-09-03 DIAGNOSIS — N6315 Unspecified lump in the right breast, overlapping quadrants: Secondary | ICD-10-CM | POA: Diagnosis not present

## 2023-09-03 DIAGNOSIS — F5101 Primary insomnia: Secondary | ICD-10-CM

## 2023-09-03 DIAGNOSIS — Z01419 Encounter for gynecological examination (general) (routine) without abnormal findings: Secondary | ICD-10-CM | POA: Diagnosis not present

## 2023-09-03 MED ORDER — ESTRADIOL 0.1 MG/24HR TD PTWK: 0.1000 mg | MEDICATED_PATCH | TRANSDERMAL | 4 refills | Status: DC

## 2023-09-03 MED ORDER — PROGESTERONE 200 MG PO CAPS
200.0000 mg | ORAL_CAPSULE | Freq: Every day | ORAL | 4 refills | Status: AC
Start: 2023-09-03 — End: ?

## 2023-09-03 NOTE — Patient Instructions (Signed)

## 2023-09-05 ENCOUNTER — Other Ambulatory Visit: Payer: Self-pay | Admitting: Obstetrics & Gynecology

## 2023-09-05 DIAGNOSIS — F5101 Primary insomnia: Secondary | ICD-10-CM

## 2023-09-06 ENCOUNTER — Telehealth: Payer: Self-pay

## 2023-09-06 DIAGNOSIS — N6315 Unspecified lump in the right breast, overlapping quadrants: Secondary | ICD-10-CM

## 2023-09-06 NOTE — Telephone Encounter (Signed)
Spoke w/ Darlyne Russian @ [facility] and got pt scheduled for   10/02/2022 @ 1pm.  Pt notified and voiced understanding/appreciation. Encounter closed.

## 2023-09-06 NOTE — Telephone Encounter (Signed)
Prescriptions filled by me.   Please schedule [facility]  She has a right breast mass 1.5 - 2 cm at 6:00.

## 2023-09-06 NOTE — Telephone Encounter (Signed)
Med refill request: Klonopin and Eszopiclone Last AEX: 09/03/23 Next AEX: cancelled 09/17/23 appt Last MMG (if hormonal med) 08/01/23 Refill authorized: Please Advise, #8 and #30

## 2023-09-06 NOTE — Telephone Encounter (Signed)
Pt LVM in triage line requesting refill on meds-refer to med refill request dated 12/19.  However, I also noticed something in the pt's office notes re: dx mammo.  Do you want me to go ahead and facilitate this for the pt? TIA.

## 2023-09-06 NOTE — Telephone Encounter (Signed)
Pt LVM in triage line specifically requesting the refill for generic Lunesta today if at all possible due to holidays approaching.

## 2023-09-17 ENCOUNTER — Ambulatory Visit: Payer: BC Managed Care – PPO | Admitting: Obstetrics and Gynecology

## 2023-10-03 ENCOUNTER — Other Ambulatory Visit: Payer: BC Managed Care – PPO

## 2023-10-07 DIAGNOSIS — M810 Age-related osteoporosis without current pathological fracture: Secondary | ICD-10-CM | POA: Diagnosis not present

## 2023-11-05 ENCOUNTER — Other Ambulatory Visit: Payer: Self-pay | Admitting: Obstetrics and Gynecology

## 2023-11-05 DIAGNOSIS — Z1231 Encounter for screening mammogram for malignant neoplasm of breast: Secondary | ICD-10-CM

## 2023-12-03 IMAGING — MG MM DIGITAL DIAGNOSTIC UNILAT*R* W/ TOMO W/ CAD
6 series · 6 of 18 positions shown · non-contrast
Comparison: Previous exam(s).

CLINICAL DATA: Patient has focal pain in the RIGHT breast, 6
o'clock location on 1 occasion a couple weeks ago. At that time,
patient thought she palpated a mass in the 6 o'clock location.
Patient no longer feels a mass. On physical exam, her provider feels
a 2 x 3 centimeter tender mobile mass at 6 o'clock.

EXAM:
DIGITAL DIAGNOSTIC UNILATERAL RIGHT MAMMOGRAM WITH TOMOSYNTHESIS AND
CAD; ULTRASOUND RIGHT BREAST LIMITED
TECHNIQUE: Right digital diagnostic mammography and breast tomosynthesis was
performed. The images were evaluated with computer-aided detection.;
Targeted ultrasound examination of the right breast was performed

[R CC synth-2D]
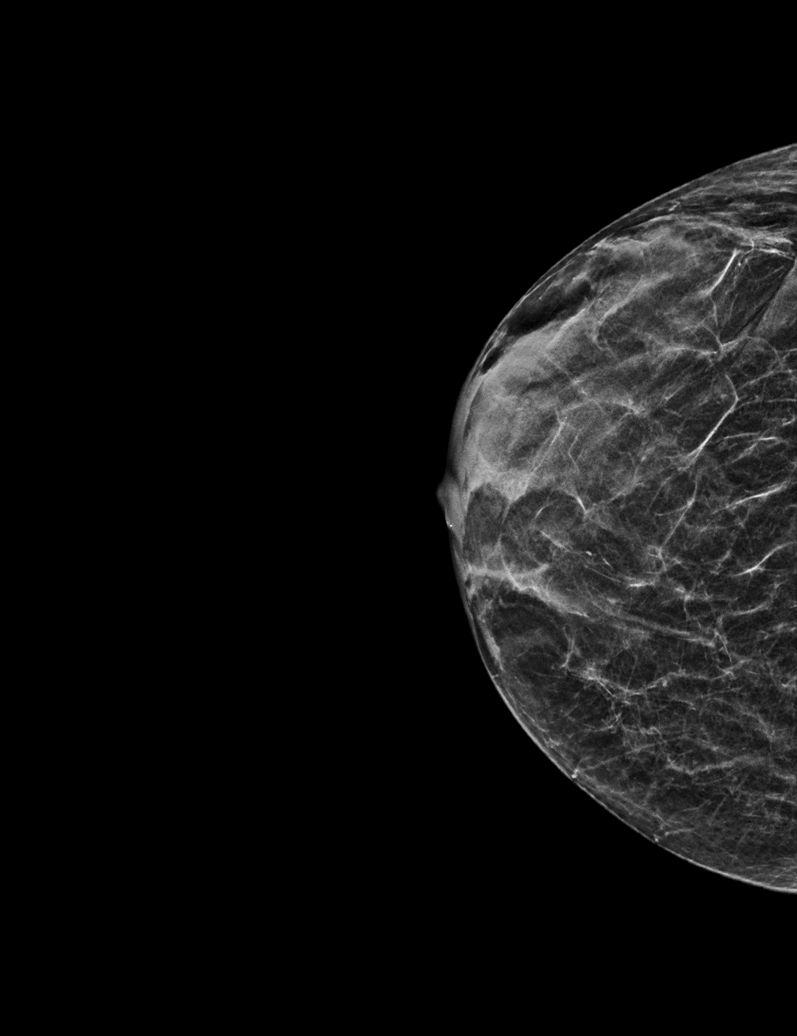

[R MLO synth-2D (1 of 2)]
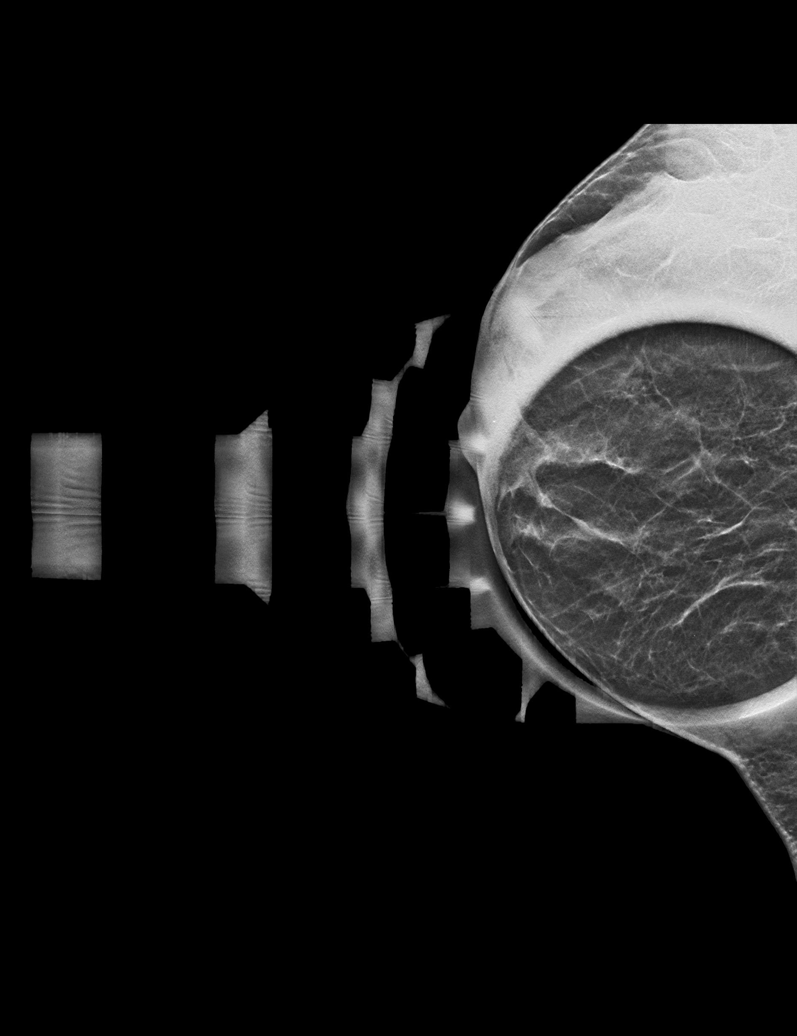

[R MLO synth-2D (2 of 2)]
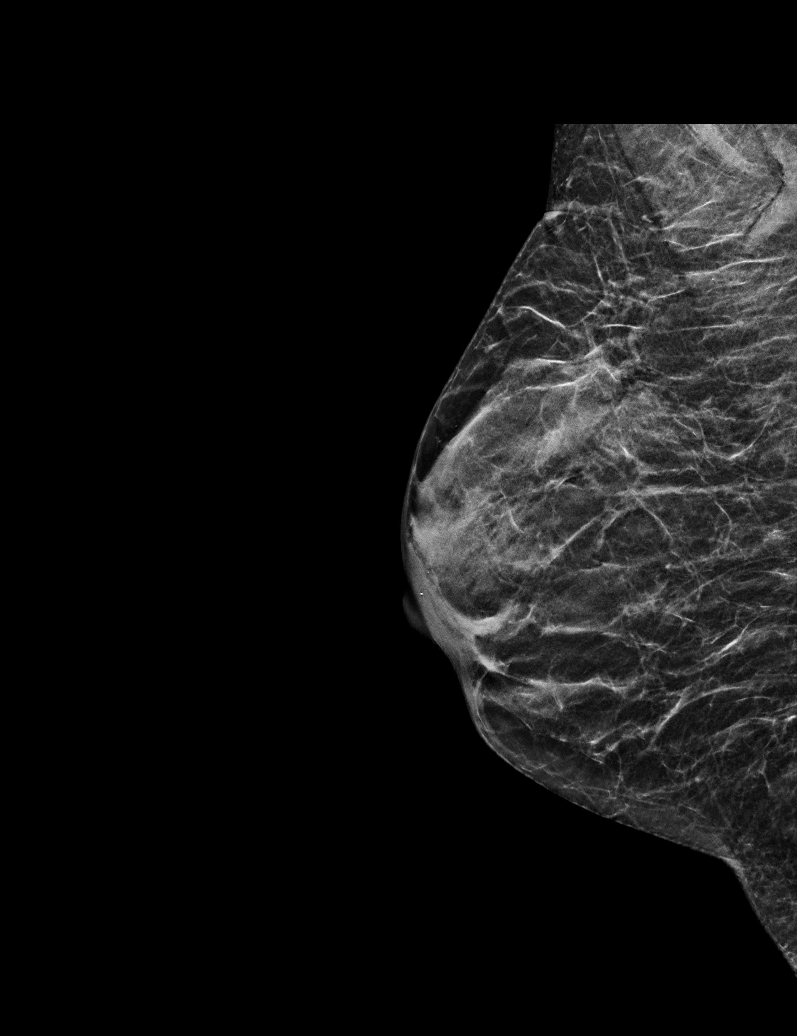

[R CC tomo · tomo slice 15/30.0]
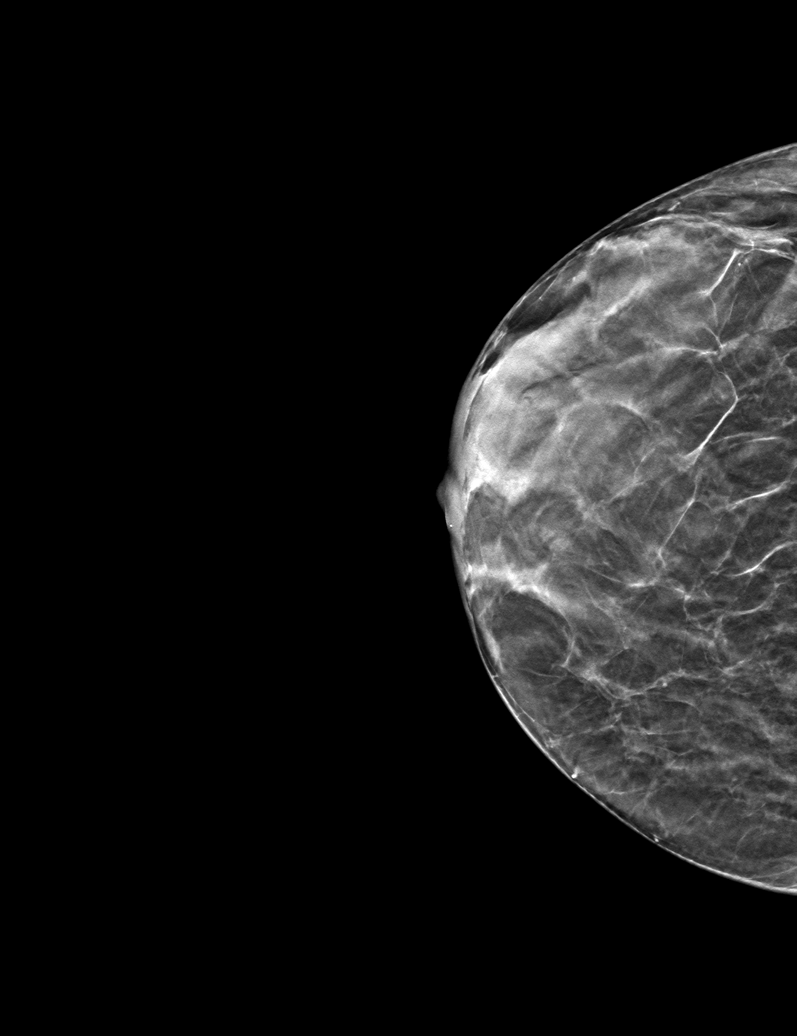

[R MLO tomo (1 of 2) · tomo slice 15/30.0]
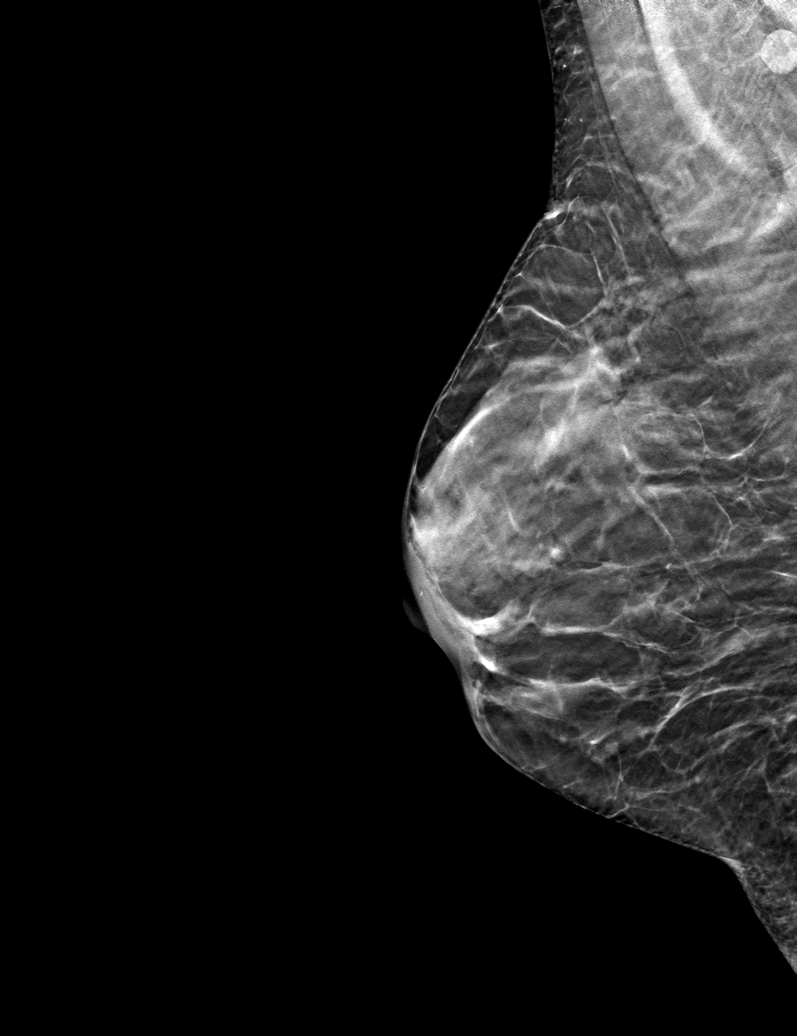

[R MLO tomo (2 of 2) · tomo slice 14/27.0]
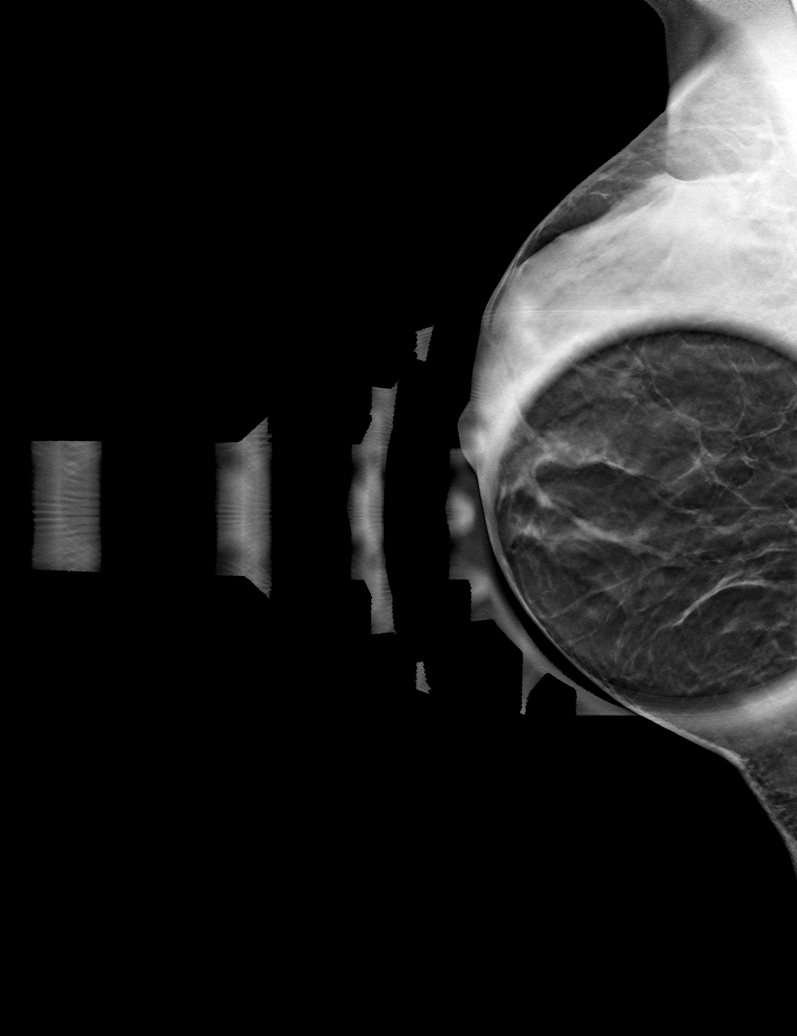

[6 of 18 positions shown; findings below may reference images not displayed]

ACR Breast Density Category c: The breast tissue is heterogeneously
dense, which may obscure small masses.
FINDINGS: No suspicious mass, distortion, or microcalcifications are
identified to suggest presence of malignancy. Spot tangential view
of the LOWER portion of the RIGHT breast shows normal appearing
fibrofatty tissue.

On physical exam, I palpate soft tissue in the LOWER central portion
of the RIGHT breast. I palpate no discrete mass. There is no visible
ecchymosis.

Targeted ultrasound is performed, showing normal appearing
fibrofatty tissue in the LOWER central RIGHT breast. No suspicious
mass, distortion, or acoustic shadowing is demonstrated with
ultrasound.
IMPRESSION: No mammographic or ultrasound evidence for malignancy.

RECOMMENDATION:
Screening mammogram is recommended June 2022.

I have discussed the findings and recommendations with the patient.
If applicable, a reminder letter will be sent to the patient
regarding the next appointment.

BI-RADS CATEGORY  1: Negative.

## 2023-12-03 IMAGING — US US BREAST*R* LIMITED INC AXILLA
1 series · 3 of 3 positions shown · non-contrast
Comparison: Previous exam(s).

CLINICAL DATA: Patient has focal pain in the RIGHT breast, 6
o'clock location on 1 occasion a couple weeks ago. At that time,
patient thought she palpated a mass in the 6 o'clock location.
Patient no longer feels a mass. On physical exam, her provider feels
a 2 x 3 centimeter tender mobile mass at 6 o'clock.

EXAM:
DIGITAL DIAGNOSTIC UNILATERAL RIGHT MAMMOGRAM WITH TOMOSYNTHESIS AND
CAD; ULTRASOUND RIGHT BREAST LIMITED
TECHNIQUE: Right digital diagnostic mammography and breast tomosynthesis was
performed. The images were evaluated with computer-aided detection.;
Targeted ultrasound examination of the right breast was performed

[Series 1: us breast*right* limited inc axilla · 0.06mm/px · 3 of 3 slices shown]
[im 1/3]
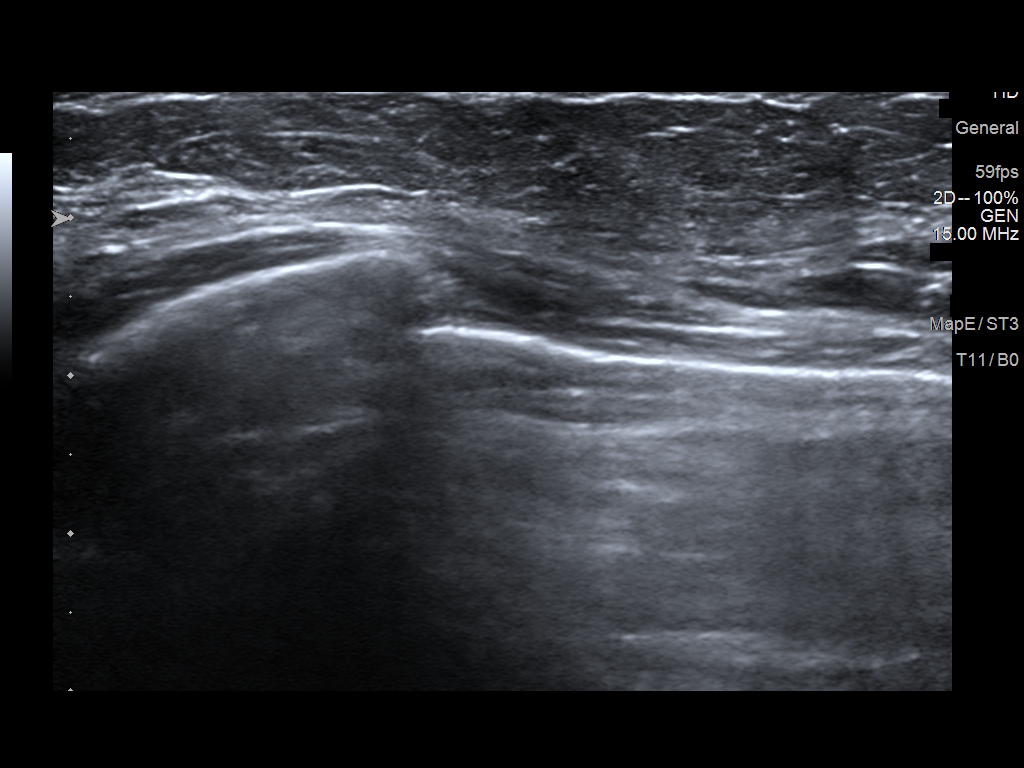
[im 2/3]
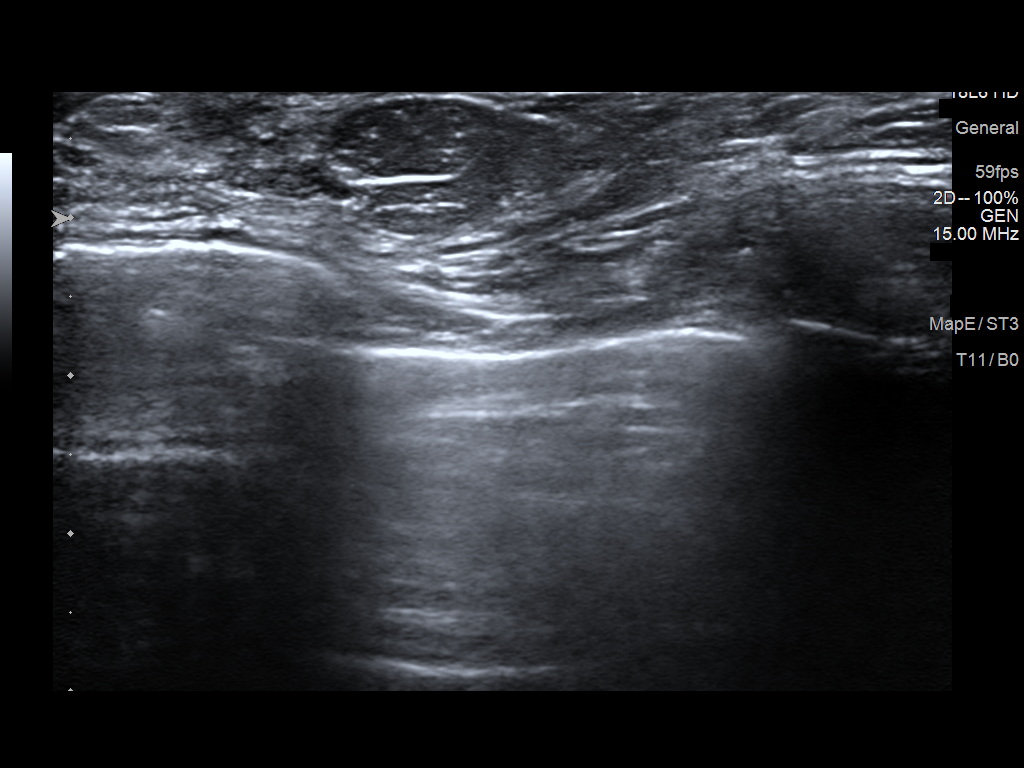
[im 3/3]
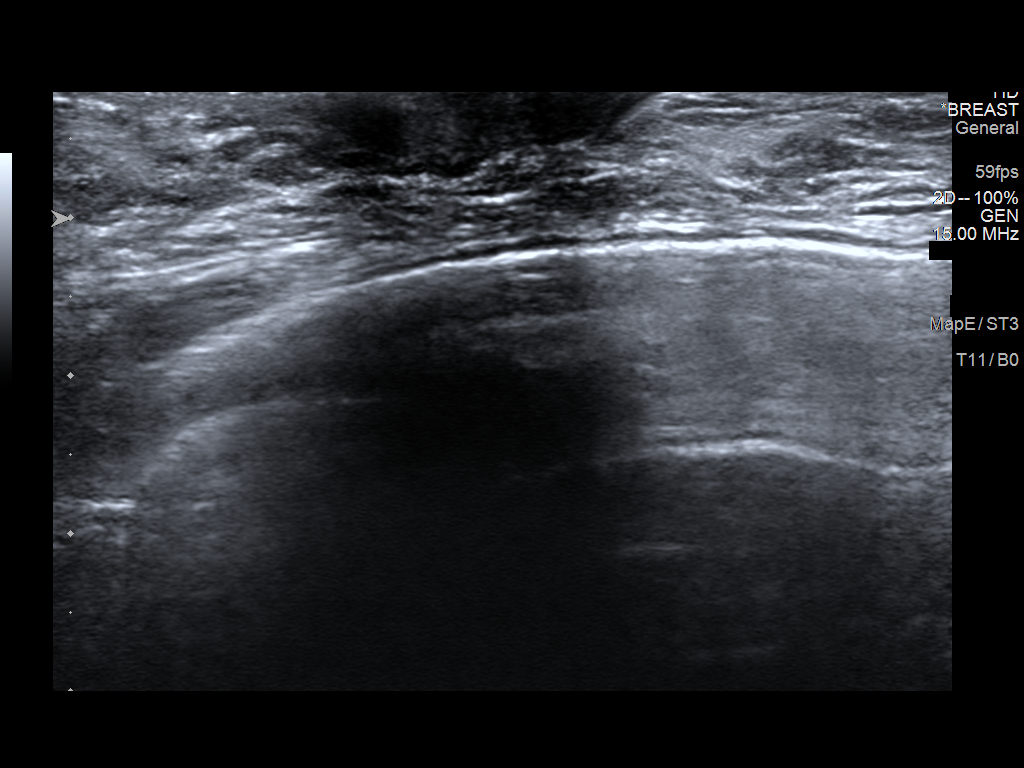

[3 of 3 positions shown; findings below may reference images not displayed]

ACR Breast Density Category c: The breast tissue is heterogeneously
dense, which may obscure small masses.
FINDINGS: No suspicious mass, distortion, or microcalcifications are
identified to suggest presence of malignancy. Spot tangential view
of the LOWER portion of the RIGHT breast shows normal appearing
fibrofatty tissue.

On physical exam, I palpate soft tissue in the LOWER central portion
of the RIGHT breast. I palpate no discrete mass. There is no visible
ecchymosis.

Targeted ultrasound is performed, showing normal appearing
fibrofatty tissue in the LOWER central RIGHT breast. No suspicious
mass, distortion, or acoustic shadowing is demonstrated with
ultrasound.
IMPRESSION: No mammographic or ultrasound evidence for malignancy.

RECOMMENDATION:
Screening mammogram is recommended June 2022.

I have discussed the findings and recommendations with the patient.
If applicable, a reminder letter will be sent to the patient
regarding the next appointment.

BI-RADS CATEGORY  1: Negative.

## 2024-01-13 DIAGNOSIS — M810 Age-related osteoporosis without current pathological fracture: Secondary | ICD-10-CM | POA: Diagnosis not present

## 2024-01-20 ENCOUNTER — Other Ambulatory Visit: Payer: Self-pay | Admitting: Nurse Practitioner

## 2024-01-20 DIAGNOSIS — M810 Age-related osteoporosis without current pathological fracture: Secondary | ICD-10-CM | POA: Diagnosis not present

## 2024-01-20 DIAGNOSIS — E559 Vitamin D deficiency, unspecified: Secondary | ICD-10-CM | POA: Diagnosis not present

## 2024-02-26 ENCOUNTER — Other Ambulatory Visit: Payer: Self-pay | Admitting: Obstetrics and Gynecology

## 2024-02-26 DIAGNOSIS — F5101 Primary insomnia: Secondary | ICD-10-CM

## 2024-02-26 NOTE — Telephone Encounter (Signed)
 Med refill request:Eszopiclone   Last AEX: 09/03/23 Next AEX: 09/03/24 Last MMG (if hormonal med) 08/01/23 BIRADS Cat 1 neg  Refill authorized: last rx 09/06/23 #30 with 5 refills. Please approve or deny

## 2024-03-30 DIAGNOSIS — Z96652 Presence of left artificial knee joint: Secondary | ICD-10-CM | POA: Diagnosis not present

## 2024-03-30 DIAGNOSIS — M25562 Pain in left knee: Secondary | ICD-10-CM | POA: Diagnosis not present

## 2024-04-29 ENCOUNTER — Other Ambulatory Visit

## 2024-05-27 ENCOUNTER — Other Ambulatory Visit (HOSPITAL_BASED_OUTPATIENT_CLINIC_OR_DEPARTMENT_OTHER)

## 2024-06-09 DIAGNOSIS — R197 Diarrhea, unspecified: Secondary | ICD-10-CM | POA: Diagnosis not present

## 2024-06-10 DIAGNOSIS — R197 Diarrhea, unspecified: Secondary | ICD-10-CM | POA: Diagnosis not present

## 2024-06-24 DIAGNOSIS — R197 Diarrhea, unspecified: Secondary | ICD-10-CM | POA: Diagnosis not present

## 2024-07-01 DIAGNOSIS — R197 Diarrhea, unspecified: Secondary | ICD-10-CM | POA: Diagnosis not present

## 2024-07-01 DIAGNOSIS — R159 Full incontinence of feces: Secondary | ICD-10-CM | POA: Diagnosis not present

## 2024-07-01 DIAGNOSIS — R194 Change in bowel habit: Secondary | ICD-10-CM | POA: Diagnosis not present

## 2024-07-02 DIAGNOSIS — R197 Diarrhea, unspecified: Secondary | ICD-10-CM | POA: Diagnosis not present

## 2024-07-06 DIAGNOSIS — R197 Diarrhea, unspecified: Secondary | ICD-10-CM | POA: Diagnosis not present

## 2024-07-16 DIAGNOSIS — L578 Other skin changes due to chronic exposure to nonionizing radiation: Secondary | ICD-10-CM | POA: Diagnosis not present

## 2024-07-16 DIAGNOSIS — L814 Other melanin hyperpigmentation: Secondary | ICD-10-CM | POA: Diagnosis not present

## 2024-07-16 DIAGNOSIS — D225 Melanocytic nevi of trunk: Secondary | ICD-10-CM | POA: Diagnosis not present

## 2024-07-16 DIAGNOSIS — L821 Other seborrheic keratosis: Secondary | ICD-10-CM | POA: Diagnosis not present

## 2024-08-03 ENCOUNTER — Ambulatory Visit (HOSPITAL_BASED_OUTPATIENT_CLINIC_OR_DEPARTMENT_OTHER): Admitting: Radiology

## 2024-08-17 DIAGNOSIS — R194 Change in bowel habit: Secondary | ICD-10-CM | POA: Diagnosis not present

## 2024-08-17 DIAGNOSIS — K648 Other hemorrhoids: Secondary | ICD-10-CM | POA: Diagnosis not present

## 2024-08-17 DIAGNOSIS — R197 Diarrhea, unspecified: Secondary | ICD-10-CM | POA: Diagnosis not present

## 2024-08-17 DIAGNOSIS — K6389 Other specified diseases of intestine: Secondary | ICD-10-CM | POA: Diagnosis not present

## 2024-08-17 DIAGNOSIS — K5730 Diverticulosis of large intestine without perforation or abscess without bleeding: Secondary | ICD-10-CM | POA: Diagnosis not present

## 2024-08-18 ENCOUNTER — Ambulatory Visit

## 2024-09-01 ENCOUNTER — Encounter: Payer: Self-pay | Admitting: Obstetrics and Gynecology

## 2024-09-02 NOTE — Progress Notes (Unsigned)
 63 y.o. [id number] Widowed Caucasian female here for annual exam.    PCP: Burney Darice CROME, MD   No LMP recorded. Patient is postmenopausal.           Sexually active: No.  The current method of family planning is post menopausal status.    Menopausal hormone therapy:  Climara  & progesterone   Exercising: {yes no:314532}  {types:19826} Smoker:  no  OB History  Gravida Para Term Preterm AB Living  4 2 2  2 2   SAB IAB Ectopic Multiple Live Births  1 1       # Outcome Date GA Lbr Len/2nd Weight Sex Type Anes PTL Lv  4 IAB           3 SAB           2 Term           1 Term              HEALTH MAINTENANCE: Last 2 paps:  08/17/22 neg, 05/16/20 neg  History of abnormal Pap or positive HPV:  no Mammogram:   08/01/23 Breast Density Cat C, BIRADS Cat 1 neg  Colonoscopy:  08/06/14 Bone Density:  01/11/22  Result  osteoporotic     There is no immunization history on file for this patient.    reports that she has never smoked. She has never used smokeless tobacco. She reports current alcohol use of about 2.0 standard drinks of alcohol per week. She reports that she does not use drugs.  Past Medical History:  Diagnosis Date   Family history of breast cancer    Family history of melanoma    Family history of prostate cancer    HSV infection    oral    Past Surgical History:  Procedure Laterality Date   cryocutery  24   DILATION AND CURETTAGE OF UTERUS  05/11/1999   TAB   KNEE SURGERY Left 01/2020   LIPOMA EXCISION  2001    Current Outpatient Medications  Medication Sig Dispense Refill   Calcium Carbonate-Vit D-Min (CALCIUM 1200 PO) Take by mouth.     cholecalciferol (VITAMIN D3) 25 MCG (1000 UT) tablet Take 1,000 Units by mouth daily.     clonazePAM  (KLONOPIN ) 0.5 MG tablet TAKE 1 TABLET BY MOUTH EVERY DAY AS NEEDED FOR ANXIETY 8 tablet 0   estradiol  (CLIMARA  - DOSED IN MG/24 HR) 0.1 mg/24hr patch Place 1 patch (0.1 mg total) onto the skin once a week. 12 patch 4    Eszopiclone  3 MG TABS TAKE 1 TABLET BY MOUTH AT BEDTIME TAKE IMMEDIATELY BEFORE BEDTIME 30 tablet 5   Multiple Vitamins-Minerals (WOMENS MULTI VITAMIN & MINERAL PO) Take 1 tablet by mouth daily.     Omega-3 Fatty Acids (FISH OIL) 1000 MG CAPS Take by mouth.     progesterone  (PROMETRIUM ) 200 MG capsule Take 1 capsule (200 mg total) by mouth at bedtime. 90 capsule 4   No current facility-administered medications for this visit.    ALLERGIES: Hydrocodone  Family History  Problem Relation Age of Onset   Heart failure Mother    Prostate cancer Father    Breast cancer Sister 5   Osteopenia Sister    Diabetes Paternal Aunt    Diabetes Paternal Uncle    Heart disease Maternal Grandmother    Hypertension Maternal Grandmother    Lung cancer Maternal Grandfather    Diabetes Paternal Grandmother    Sudden death Paternal Grandfather    Heart disease Paternal Grandfather  Melanoma Nephew 27    Review of Systems  PHYSICAL EXAM:  There were no vitals taken for this visit.    General appearance: alert, cooperative and appears stated age Head: normocephalic, without obvious abnormality, atraumatic Neck: no adenopathy, supple, symmetrical, trachea midline and thyroid normal to inspection and palpation Lungs: clear to auscultation bilaterally Breasts: normal appearance, no masses or tenderness, No nipple retraction or dimpling, No nipple discharge or bleeding, No axillary adenopathy Heart: regular rate and rhythm Abdomen: soft, non-tender; no masses, no organomegaly Extremities: extremities normal, atraumatic, no cyanosis or edema Skin: skin color, texture, turgor normal. No rashes or lesions Lymph nodes: cervical, supraclavicular, and axillary nodes normal. Neurologic: grossly normal  Pelvic: External genitalia:  no lesions              No abnormal inguinal nodes palpated.              Urethra:  normal appearing urethra with no masses, tenderness or lesions              Bartholins and  Skenes: normal                 Vagina: normal appearing vagina with normal color and discharge, no lesions              Cervix: no lesions              Pap taken: {yes no:314532} Bimanual Exam:  Uterus:  normal size, contour, position, consistency, mobility, non-tender              Adnexa: no mass, fullness, tenderness              Rectal exam: {yes no:314532}.  Confirms.              Anus:  normal sphincter tone, no lesions  Chaperone was present for exam:  {BSCHAPERONE:31226::Emily F, CMA}  ASSESSMENT: Well woman visit with gynecologic exam.  PHQ-2-9: ***  ***  PLAN: Mammogram screening discussed. Self breast awareness reviewed. Pap and HRV collected:  {yes no:314532} Guidelines for Calcium, Vitamin D , regular exercise program including cardiovascular and weight bearing exercise. Medication refills:  *** {LABS (Optional):23779} Follow up:  ***    Additional counseling given.  {yes [id number]. ***  total time was spent for this patient encounter, including preparation, face-to-face counseling with the patient, coordination of care, and documentation of the encounter in addition to doing the well woman visit with gynecologic exam.

## 2024-09-03 ENCOUNTER — Ambulatory Visit: Admitting: Obstetrics and Gynecology

## 2024-09-03 ENCOUNTER — Encounter: Payer: Self-pay | Admitting: Obstetrics and Gynecology

## 2024-09-03 VITALS — BP 114/72 | Ht 64.75 in | Wt 127.0 lb

## 2024-09-03 DIAGNOSIS — Z01419 Encounter for gynecological examination (general) (routine) without abnormal findings: Secondary | ICD-10-CM

## 2024-09-03 DIAGNOSIS — Z1331 Encounter for screening for depression: Secondary | ICD-10-CM | POA: Diagnosis not present

## 2024-09-03 DIAGNOSIS — F5101 Primary insomnia: Secondary | ICD-10-CM

## 2024-09-03 MED ORDER — CLONAZEPAM 0.5 MG PO TABS: 0.5000 mg | ORAL_TABLET | Freq: Once | ORAL | 0 refills | Status: AC | PRN

## 2024-09-03 MED ORDER — ESTRADIOL 0.1 MG/24HR TD PTWK: 0.1000 mg | MEDICATED_PATCH | TRANSDERMAL | 4 refills | Status: AC

## 2024-09-03 MED ORDER — PROGESTERONE 200 MG PO CAPS: 200.0000 mg | ORAL_CAPSULE | Freq: Every day | ORAL | 4 refills | Status: AC

## 2024-09-03 NOTE — Patient Instructions (Signed)

## 2024-09-07 ENCOUNTER — Encounter: Payer: Self-pay | Admitting: Obstetrics and Gynecology

## 2024-09-28 ENCOUNTER — Ambulatory Visit
Admission: RE | Admit: 2024-09-28 | Discharge: 2024-09-28 | Disposition: A | Source: Ambulatory Visit | Attending: Obstetrics and Gynecology | Admitting: Obstetrics and Gynecology

## 2024-09-28 DIAGNOSIS — Z1231 Encounter for screening mammogram for malignant neoplasm of breast: Secondary | ICD-10-CM

## 2024-09-30 ENCOUNTER — Ambulatory Visit: Payer: Self-pay | Admitting: Obstetrics and Gynecology

## 2024-10-01 ENCOUNTER — Other Ambulatory Visit: Payer: Self-pay | Admitting: Obstetrics and Gynecology

## 2024-10-01 DIAGNOSIS — R928 Other abnormal and inconclusive findings on diagnostic imaging of breast: Secondary | ICD-10-CM

## 2024-11-17 ENCOUNTER — Encounter

## 2024-11-26 ENCOUNTER — Other Ambulatory Visit (HOSPITAL_BASED_OUTPATIENT_CLINIC_OR_DEPARTMENT_OTHER)

## 2025-09-07 ENCOUNTER — Ambulatory Visit: Admitting: Obstetrics and Gynecology
